# Patient Record
Sex: Female | Born: 1955
Health system: Southern US, Community
[De-identification: ages and names within clinical notes are randomized; demographics above are authoritative.]

## PROBLEM LIST (undated history)

## (undated) DIAGNOSIS — K219 Gastro-esophageal reflux disease without esophagitis: Secondary | ICD-10-CM

## (undated) DIAGNOSIS — Z8619 Personal history of other infectious and parasitic diseases: Secondary | ICD-10-CM

## (undated) DIAGNOSIS — F419 Anxiety disorder, unspecified: Secondary | ICD-10-CM

## (undated) DIAGNOSIS — E785 Hyperlipidemia, unspecified: Secondary | ICD-10-CM

## (undated) DIAGNOSIS — F32A Depression, unspecified: Secondary | ICD-10-CM

## (undated) DIAGNOSIS — I10 Essential (primary) hypertension: Secondary | ICD-10-CM

## (undated) DIAGNOSIS — E66813 Obesity, class 3: Secondary | ICD-10-CM

## (undated) DIAGNOSIS — M109 Gout, unspecified: Secondary | ICD-10-CM

## (undated) HISTORY — DX: Hyperlipidemia, unspecified: E78.5

## (undated) HISTORY — PX: BREAST BIOPSY: SHX20

## (undated) HISTORY — DX: Depression, unspecified: F32.A

## (undated) HISTORY — DX: Obesity, class 3: E66.813

## (undated) HISTORY — DX: Morbid (severe) obesity due to excess calories: E66.01

## (undated) HISTORY — DX: Personal history of other infectious and parasitic diseases: Z86.19

## (undated) HISTORY — DX: Gout, unspecified: M10.9

---

## 2017-05-06 DIAGNOSIS — N390 Urinary tract infection, site not specified: Secondary | ICD-10-CM | POA: Diagnosis not present

## 2017-05-06 DIAGNOSIS — R300 Dysuria: Secondary | ICD-10-CM | POA: Diagnosis not present

## 2017-05-06 DIAGNOSIS — Z6837 Body mass index (BMI) 37.0-37.9, adult: Secondary | ICD-10-CM | POA: Diagnosis not present

## 2017-05-15 ENCOUNTER — Emergency Department (HOSPITAL_COMMUNITY)
Admission: EM | Admit: 2017-05-15 | Discharge: 2017-05-15 | Disposition: A | Discharge status: Home / Self Care | Payer: Self-pay | Attending: Emergency Medicine | Admitting: Emergency Medicine

## 2017-05-15 ENCOUNTER — Other Ambulatory Visit: Payer: Self-pay

## 2017-05-15 ENCOUNTER — Encounter (HOSPITAL_COMMUNITY): Payer: Self-pay | Admitting: *Deleted

## 2017-05-15 ENCOUNTER — Emergency Department (HOSPITAL_COMMUNITY): Payer: Self-pay

## 2017-05-15 DIAGNOSIS — Y939 Activity, unspecified: Secondary | ICD-10-CM | POA: Insufficient documentation

## 2017-05-15 DIAGNOSIS — Y999 Unspecified external cause status: Secondary | ICD-10-CM | POA: Insufficient documentation

## 2017-05-15 DIAGNOSIS — W19XXXA Unspecified fall, initial encounter: Secondary | ICD-10-CM

## 2017-05-15 DIAGNOSIS — Y929 Unspecified place or not applicable: Secondary | ICD-10-CM | POA: Insufficient documentation

## 2017-05-15 DIAGNOSIS — I10 Essential (primary) hypertension: Secondary | ICD-10-CM | POA: Insufficient documentation

## 2017-05-15 DIAGNOSIS — S060X0A Concussion without loss of consciousness, initial encounter: Secondary | ICD-10-CM | POA: Insufficient documentation

## 2017-05-15 DIAGNOSIS — W11XXXA Fall on and from ladder, initial encounter: Secondary | ICD-10-CM | POA: Insufficient documentation

## 2017-05-15 DIAGNOSIS — S0990XA Unspecified injury of head, initial encounter: Secondary | ICD-10-CM

## 2017-05-15 DIAGNOSIS — Z79899 Other long term (current) drug therapy: Secondary | ICD-10-CM | POA: Insufficient documentation

## 2017-05-15 HISTORY — DX: Essential (primary) hypertension: I10

## 2017-05-15 LAB — CBC WITH DIFFERENTIAL/PLATELET
BASOS ABS: 0 10*3/uL (ref 0.0–0.1)
BASOS PCT: 0 %
EOS PCT: 3 %
Eosinophils Absolute: 0.2 10*3/uL (ref 0.0–0.7)
HCT: 39 % (ref 36.0–46.0)
Hemoglobin: 13.1 g/dL (ref 12.0–15.0)
Lymphocytes Relative: 23 %
Lymphs Abs: 1.3 10*3/uL (ref 0.7–4.0)
MCH: 29.9 pg (ref 26.0–34.0)
MCHC: 33.6 g/dL (ref 30.0–36.0)
MCV: 89 fL (ref 78.0–100.0)
Monocytes Absolute: 0.3 10*3/uL (ref 0.1–1.0)
Monocytes Relative: 6 %
Neutro Abs: 3.9 10*3/uL (ref 1.7–7.7)
Neutrophils Relative %: 68 %
PLATELETS: 223 10*3/uL (ref 150–400)
RBC: 4.38 MIL/uL (ref 3.87–5.11)
RDW: 14.3 % (ref 11.5–15.5)
WBC: 5.8 10*3/uL (ref 4.0–10.5)

## 2017-05-15 LAB — BASIC METABOLIC PANEL
ANION GAP: 10 (ref 5–15)
BUN: 24 mg/dL — ABNORMAL HIGH (ref 6–20)
CALCIUM: 9.4 mg/dL (ref 8.9–10.3)
CO2: 23 mmol/L (ref 22–32)
Chloride: 107 mmol/L (ref 101–111)
Creatinine, Ser: 1.21 mg/dL — ABNORMAL HIGH (ref 0.44–1.00)
GFR, EST AFRICAN AMERICAN: 55 mL/min — AB (ref >60)
GFR, EST NON AFRICAN AMERICAN: 48 mL/min — AB (ref >60)
GLUCOSE: 98 mg/dL (ref 65–99)
POTASSIUM: 4.5 mmol/L (ref 3.5–5.1)
Sodium: 140 mmol/L (ref 135–145)

## 2017-05-15 MED ORDER — PROMETHAZINE HCL 25 MG/ML IJ SOLN
12.5000 mg | Freq: Once | INTRAMUSCULAR | Status: AC
  Administered 2017-05-15: 12.5 mg via INTRAVENOUS
  Filled 2017-05-15: qty 1

## 2017-05-15 MED ORDER — HYDROCODONE-ACETAMINOPHEN 5-325 MG PO TABS: 1.0000 | ORAL_TABLET | Freq: Four times a day (QID) | ORAL | 0 refills | Status: DC | PRN

## 2017-05-15 MED ORDER — SODIUM CHLORIDE 0.9 % IV SOLN
INTRAVENOUS | Status: DC
  Administered 2017-05-15: 75 mL/h via INTRAVENOUS

## 2017-05-15 MED ORDER — FENTANYL CITRATE (PF) 100 MCG/2ML IJ SOLN
25.0000 ug | Freq: Once | INTRAMUSCULAR | Status: AC
  Administered 2017-05-15: 25 ug via INTRAVENOUS
  Filled 2017-05-15: qty 2

## 2017-05-15 MED ORDER — HYDROMORPHONE HCL 1 MG/ML IJ SOLN
1.0000 mg | Freq: Once | INTRAMUSCULAR | Status: AC
  Administered 2017-05-15: 1 mg via INTRAVENOUS
  Filled 2017-05-15: qty 1

## 2017-05-15 MED ORDER — SODIUM CHLORIDE 0.9 % IV BOLUS (SEPSIS)
500.0000 mL | Freq: Once | INTRAVENOUS | Status: AC
  Administered 2017-05-15: 500 mL via INTRAVENOUS

## 2017-05-15 MED ORDER — PROMETHAZINE HCL 25 MG/ML IJ SOLN: 25.0000 mg | Freq: Once | INTRAMUSCULAR | Status: DC

## 2017-05-15 MED ORDER — PROMETHAZINE HCL 25 MG PO TABS: 25.0000 mg | ORAL_TABLET | Freq: Four times a day (QID) | ORAL | 0 refills | Status: DC | PRN

## 2017-05-15 MED ORDER — ONDANSETRON HCL 4 MG/2ML IJ SOLN
4.0000 mg | Freq: Once | INTRAMUSCULAR | Status: AC
  Administered 2017-05-15: 4 mg via INTRAVENOUS
  Filled 2017-05-15: qty 2

## 2017-05-15 NOTE — ED Provider Notes (Signed)
[facility] Provider Note   CSN: [id number] Arrival date & time: 05/15/17  0930     History   Chief Complaint Chief Complaint  Patient presents with  . Fall  . Altered Mental Status    HPI Catherine Johnson is a 62 y.o. female.  Patient with a fall at work.  Brought in by EMS.  Patient fell off a ladder approximately 2 feet.  When her foot became trapped in the latter.  EMS reported brief loss of consciousness.  Patient reported to me that she had not been knocked out.  Patient with one episode of vomiting.  Improved with Zofran.  Upon arrival patient had a fair amount of photophobia could follow all commands no obvious neuro focal deficit.  Patient's only complaint was head pain.  Neck was wrapped in a towel and taped for support.  By EMS.  Patient denied any injuries to her low back arms or legs.      Past Medical History:  Diagnosis Date  . Hypertension     There are no active problems to display for this patient.   History reviewed. No pertinent surgical history.  OB History    No data available       Home Medications    Prior to Admission medications   Medication Sig Start Date End Date Taking? Authorizing Provider  allopurinol (ZYLOPRIM) 300 MG tablet Take 300 mg by mouth daily.   Yes [provider]  benazepril (LOTENSIN) 40 MG tablet Take 40 mg by mouth daily.   Yes [provider]  hydrochlorothiazide (HYDRODIURIL) 25 MG tablet Take 25 mg by mouth daily.   Yes [provider]  metoprolol tartrate (LOPRESSOR) 50 MG tablet Take 50 mg by mouth 2 (two) times daily.   Yes [provider]  Multiple Vitamin (MULTIVITAMIN WITH MINERALS) TABS tablet Take 1 tablet by mouth daily.   Yes [provider]  naproxen sodium (ALEVE) 220 MG tablet Take 440 mg by mouth as needed (headache).   Yes [provider]  tetrahydrozoline 0.05 % ophthalmic solution Place 1 drop into both eyes  as needed (dry eyes).   Yes [provider]  HYDROcodone-acetaminophen (NORCO/VICODIN) 5-325 MG tablet Take 1-2 tablets by mouth every 6 (six) hours as needed for moderate pain. 05/15/17   Fredia Sorrow, MD  promethazine (PHENERGAN) 25 MG tablet Take 1 tablet (25 mg total) by mouth every 6 (six) hours as needed for nausea or vomiting. 05/15/17   Fredia Sorrow, MD    Family History No family history on file.  Social History Social History   Tobacco Use  . Smoking status: Never Smoker  . Smokeless tobacco: Never Used  Substance Use Topics  . Alcohol use: No    Frequency: Never  . Drug use: No     Allergies   Patient has no known allergies.   Review of Systems Review of Systems  Constitutional: Negative for fever.  HENT: Negative for congestion.   Eyes: Positive for photophobia.  Respiratory: Negative for shortness of breath.   Cardiovascular: Negative for chest pain.  Gastrointestinal: Negative for abdominal pain.  Genitourinary: Negative for dysuria.  Musculoskeletal: Negative for back pain and neck pain.  Skin: Negative for wound.  Neurological: Positive for headaches.  Hematological: Does not bruise/bleed easily.  Psychiatric/Behavioral: Negative for confusion.     Physical Exam Updated Vital Signs BP (!) 159/92   Pulse 67   Temp 98.6 F (37 C) (Oral)   Resp  17   Ht 1.854 m (6\' 1" )   Wt 122.5 kg (270 lb)   SpO2 99%   BMI 35.62 kg/m   Physical Exam  Constitutional: She is oriented to person, place, and time. She appears well-developed and well-nourished. No distress.  HENT:  Head: Normocephalic and atraumatic.  Mouth/Throat: Oropharynx is clear and moist.  Eyes: Conjunctivae and EOM are normal. Pupils are equal, round, and reactive to light.  Neck: Normal range of motion. Neck supple.  Cardiovascular: Normal rate, regular rhythm and normal heart sounds.  Pulmonary/Chest: Effort normal and breath sounds normal. She exhibits no tenderness.    Abdominal: Soft. Bowel sounds are normal. There is no tenderness.  Musculoskeletal: Normal range of motion. She exhibits no tenderness.  Neurological: She is alert and oriented to person, place, and time. No cranial nerve deficit or sensory deficit. She exhibits normal muscle tone. Coordination normal.  Skin: Skin is warm.  Nursing note and vitals reviewed.    ED Treatments / Results  Labs (all labs ordered are listed, but only abnormal results are displayed) Labs Reviewed  BASIC METABOLIC PANEL - Abnormal; Notable for the following components:      Result Value   BUN 24 (*)    Creatinine, Ser 1.21 (*)    GFR calc non Af Amer 48 (*)    GFR calc Af Amer 55 (*)    All other components within normal limits  CBC WITH DIFFERENTIAL/PLATELET    EKG  EKG Interpretation None       Radiology Ct Head Wo Contrast  Result Date: 05/15/2017 CLINICAL DATA:  Fall from ladder. EXAM: CT HEAD WITHOUT CONTRAST CT CERVICAL SPINE WITHOUT CONTRAST TECHNIQUE: Multidetector CT imaging of the head and cervical spine was performed following the standard protocol without intravenous contrast. Multiplanar CT image reconstructions of the cervical spine were also generated. COMPARISON:  None. FINDINGS: CT HEAD FINDINGS Brain: No acute intracranial abnormality. Specifically, no hemorrhage, hydrocephalus, mass lesion, acute infarction, or significant intracranial injury. Vascular: No hyperdense vessel or unexpected calcification. Skull: No acute calvarial abnormality. Sinuses/Orbits: Visualized paranasal sinuses and mastoids clear. Orbital soft tissues unremarkable. Other: None CT CERVICAL SPINE FINDINGS Alignment: Normal Skull base and vertebrae: No fracture Soft tissues and spinal canal: Prevertebral soft tissues are normal. No epidural or paraspinal hematoma. Disc levels: Degenerative disc disease with disc space narrowing and spurring, most pronounced at C5-6. Upper chest: No acute findings Other: No acute  findings IMPRESSION: No acute intracranial abnormality. No acute bony abnormality in the cervical spine. Electronically Signed   By: Rolm Baptise M.D.   On: 05/15/2017 10:18   Ct Cervical Spine Wo Contrast  Result Date: 05/15/2017 CLINICAL DATA:  Fall from ladder. EXAM: CT HEAD WITHOUT CONTRAST CT CERVICAL SPINE WITHOUT CONTRAST TECHNIQUE: Multidetector CT imaging of the head and cervical spine was performed following the standard protocol without intravenous contrast. Multiplanar CT image reconstructions of the cervical spine were also generated. COMPARISON:  None. FINDINGS: CT HEAD FINDINGS Brain: No acute intracranial abnormality. Specifically, no hemorrhage, hydrocephalus, mass lesion, acute infarction, or significant intracranial injury. Vascular: No hyperdense vessel or unexpected calcification. Skull: No acute calvarial abnormality. Sinuses/Orbits: Visualized paranasal sinuses and mastoids clear. Orbital soft tissues unremarkable. Other: None CT CERVICAL SPINE FINDINGS Alignment: Normal Skull base and vertebrae: No fracture Soft tissues and spinal canal: Prevertebral soft tissues are normal. No epidural or paraspinal hematoma. Disc levels: Degenerative disc disease with disc space narrowing and spurring, most pronounced at C5-6. Upper chest: No acute findings Other: No  acute findings IMPRESSION: No acute intracranial abnormality. No acute bony abnormality in the cervical spine. Electronically Signed   By: Rolm Baptise M.D.   On: 05/15/2017 10:18    Procedures Procedures (including critical care time)  Medications Ordered in ED Medications  0.9 %  sodium chloride infusion (75 mL/hr Intravenous New Bag/Given 05/15/17 1120)  sodium chloride 0.9 % bolus 500 mL (500 mLs Intravenous New Bag/Given 05/15/17 1120)  ondansetron (ZOFRAN) injection 4 mg (4 mg Intravenous Given 05/15/17 1015)  fentaNYL (SUBLIMAZE) injection 25 mcg (25 mcg Intravenous Given 05/15/17 1119)  HYDROmorphone (DILAUDID) injection 1 mg (1  mg Intravenous Given 05/15/17 1251)  promethazine (PHENERGAN) injection 12.5 mg (12.5 mg Intravenous Given 05/15/17 1251)     Initial Impression / Assessment and Plan / ED Course  I have reviewed the triage vital signs and the nursing notes.  Pertinent labs & imaging results that were available during my care of the patient were reviewed by me and considered in my medical decision making (see chart for details).    Patient symptoms consistent with concussion.  There may have been a brief very brief period of loss of consciousness.  Patient's workup with CT head and neck and labs without any significant abnormalities.  Patient will need to be out of work for 1 week.  Will need follow-up by neurology. Bobby Rumpf   Final Clinical Impressions(s) / ED Diagnoses   Final diagnoses:  Fall, initial encounter  Injury of head, initial encounter  Concussion without loss of consciousness, initial encounter    ED Discharge Orders        Ordered    HYDROcodone-acetaminophen (NORCO/VICODIN) 5-325 MG tablet  Every 6 hours PRN     05/15/17 1441    promethazine (PHENERGAN) 25 MG tablet  Every 6 hours PRN     05/15/17 1441       Fredia Sorrow, MD 05/16/17 [phone]

## 2017-05-15 NOTE — ED Triage Notes (Signed)
Pt here from work via [employer] after falling off a ladder approx 2 feet, when her foot became trapped in the ladder.  Brief loc, several seconds.  Pt initially unable to speak and could not state name  And situation to ems for several minutes.  Emesis x 1, but relief from nausea with zofran.  Alert to self and able to follow commands.

## 2017-05-15 NOTE — ED Notes (Signed)
IV removed. Cathter in tact

## 2017-05-15 NOTE — Discharge Instructions (Signed)
Referral information provided for neurology.  Call and make an appointment.  Work note provided to be out of work for a week.  Take the hydrocodone as needed for the head pain.  And take the Phenergan as needed for the nausea and vomiting.

## 2017-05-26 ENCOUNTER — Emergency Department (HOSPITAL_BASED_OUTPATIENT_CLINIC_OR_DEPARTMENT_OTHER)
Admission: EM | Admit: 2017-05-26 | Discharge: 2017-05-26 | Disposition: A | Discharge status: Home / Self Care | Payer: Self-pay | Attending: Emergency Medicine | Admitting: Emergency Medicine

## 2017-05-26 ENCOUNTER — Encounter (HOSPITAL_BASED_OUTPATIENT_CLINIC_OR_DEPARTMENT_OTHER): Payer: Self-pay | Admitting: *Deleted

## 2017-05-26 ENCOUNTER — Other Ambulatory Visit: Payer: Self-pay

## 2017-05-26 DIAGNOSIS — Z79899 Other long term (current) drug therapy: Secondary | ICD-10-CM | POA: Insufficient documentation

## 2017-05-26 DIAGNOSIS — Y9389 Activity, other specified: Secondary | ICD-10-CM | POA: Insufficient documentation

## 2017-05-26 DIAGNOSIS — Y999 Unspecified external cause status: Secondary | ICD-10-CM | POA: Insufficient documentation

## 2017-05-26 DIAGNOSIS — S161XXA Strain of muscle, fascia and tendon at neck level, initial encounter: Secondary | ICD-10-CM | POA: Insufficient documentation

## 2017-05-26 DIAGNOSIS — W1789XA Other fall from one level to another, initial encounter: Secondary | ICD-10-CM | POA: Insufficient documentation

## 2017-05-26 DIAGNOSIS — W19XXXD Unspecified fall, subsequent encounter: Secondary | ICD-10-CM

## 2017-05-26 DIAGNOSIS — I10 Essential (primary) hypertension: Secondary | ICD-10-CM | POA: Insufficient documentation

## 2017-05-26 DIAGNOSIS — Y929 Unspecified place or not applicable: Secondary | ICD-10-CM | POA: Insufficient documentation

## 2017-05-26 MED ORDER — DIAZEPAM 5 MG PO TABS
5.0000 mg | ORAL_TABLET | Freq: Once | ORAL | Status: AC
  Administered 2017-05-26: 5 mg via ORAL
  Filled 2017-05-26: qty 1

## 2017-05-26 MED ORDER — LIDOCAINE 5 % EX PTCH
1.0000 | MEDICATED_PATCH | CUTANEOUS | Status: DC
  Filled 2017-05-26: qty 1

## 2017-05-26 MED ORDER — LIDOCAINE 5 % EX PTCH: 1.0000 | MEDICATED_PATCH | CUTANEOUS | 0 refills | Status: DC

## 2017-05-26 MED ORDER — NAPROXEN 500 MG PO TABS: 500.0000 mg | ORAL_TABLET | Freq: Two times a day (BID) | ORAL | 0 refills | Status: DC

## 2017-05-26 MED ORDER — KETOROLAC TROMETHAMINE 60 MG/2ML IM SOLN
30.0000 mg | Freq: Once | INTRAMUSCULAR | Status: AC
  Administered 2017-05-26: 30 mg via INTRAMUSCULAR
  Filled 2017-05-26: qty 2

## 2017-05-26 MED ORDER — DIAZEPAM 5 MG PO TABS: 5.0000 mg | ORAL_TABLET | Freq: Two times a day (BID) | ORAL | 0 refills | Status: DC | PRN

## 2017-05-26 NOTE — ED Triage Notes (Signed)
Pt fell at work on 05/15/17. She has been complaining of neck, back, and head pain that is worsening.

## 2017-05-26 NOTE — ED Notes (Signed)
NAD at this time. Pt is stable and going home.  

## 2017-05-26 NOTE — Discharge Instructions (Addendum)
We recommend the use of naproxen daily as prescribed.  You may supplement this with Valium as needed for muscle spasms.  Apply a Lidoderm patch to the back of your neck and leave on for 12 hours before removing.  This will help with additional pain control.  Should your symptoms persist, you may take the pain medication previously prescribed to you.  Alternate ice and heat to areas of pain/injury to limit swelling and muscle spasm.  We advise follow-up with a neurologist as well as an orthopedist regarding your visit today.

## 2017-05-26 NOTE — ED Provider Notes (Signed)
[facility] EMERGENCY DEPARTMENT Provider Note   CSN: [id number] Arrival date & time: 05/26/17  0849     History   Chief Complaint Chief Complaint  Patient presents with  . Fall    HPI Catherine Johnson is a 62 y.o. female.   62 year old female with a history of hypertension presents to the emergency department for persistent pain after a fall at work on 05/15/2017.  Patient reports falling off a ladder.  Previous note indicates fall from height of approximately 2 feet.  Patient did strike her head during the fall and reports loss of consciousness.  She had negative head and neck CTs when evaluated on the date of her injury.  She has since followed up with a primary care doctor, but states that the oxycodone prescribed to her has not been helping her pain.  She notes that she is experiencing constant soreness which is sometimes worse in the night, causing her to wake from sleep.  She has been taking Aleve fairly regularly as well.  She was prescribed a nausea medication to take for nausea and lack of appetite.  She notes intermittent dizziness as well.  No repeat syncope or inability to ambulate.  She denies bowel and bladder incontinence.  No extremity weakness.  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   The history is provided by the patient. No language interpreter was used.  Fall     Past Medical History:  Diagnosis Date  . Hypertension     There are no active problems to display for this patient.   History reviewed. No pertinent surgical history.  OB History    No data available       Home Medications    Prior to Admission medications   Medication Sig Start Date End Date Taking? Authorizing Provider  allopurinol (ZYLOPRIM) 300 MG tablet Take 300 mg by mouth daily.   Yes [provider]  benazepril (LOTENSIN) 40 MG tablet Take 40 mg by mouth daily.   Yes  [provider]  hydrochlorothiazide (HYDRODIURIL) 25 MG tablet Take 25 mg by mouth daily.   Yes [provider]  HYDROcodone-acetaminophen (NORCO/VICODIN) 5-325 MG tablet Take 1-2 tablets by mouth every 6 (six) hours as needed for moderate pain. 05/15/17  Yes Fredia Sorrow, MD  metoprolol tartrate (LOPRESSOR) 50 MG tablet Take 50 mg by mouth 2 (two) times daily.   Yes [provider]  Multiple Vitamin (MULTIVITAMIN WITH MINERALS) TABS tablet Take 1 tablet by mouth daily.   Yes [provider]  promethazine (PHENERGAN) 25 MG tablet Take 1 tablet (25 mg total) by mouth every 6 (six) hours as needed for nausea or vomiting. 05/15/17  Yes Fredia Sorrow, MD  tetrahydrozoline 0.05 % ophthalmic solution Place 1 drop into both eyes as needed (dry eyes).   Yes [provider]  diazepam (VALIUM) 5 MG tablet Take 1 tablet (5 mg total) by mouth every 12 (twelve) hours as needed for anxiety or muscle spasms. 05/26/17   Antonietta Breach, PA-C  lidocaine (LIDODERM) 5 % Place 1 patch onto the skin daily. Remove & Discard patch within 12 hours 05/26/17   Antonietta Breach, PA-C  naproxen (NAPROSYN) 500 MG tablet Take 1 tablet (500 mg total) by mouth 2 (two) times daily. 05/26/17   Antonietta Breach, PA-C    Family History History reviewed. No pertinent family history.  Social History Social History   Tobacco  Use  . Smoking status: Never Smoker  . Smokeless tobacco: Never Used  Substance Use Topics  . Alcohol use: No    Frequency: Never  . Drug use: No     Allergies   Patient has no known allergies.   Review of Systems Review of Systems Ten systems reviewed and are negative for acute change, except as noted in the HPI.    Physical Exam Updated Vital Signs BP (!) 130/97 (BP Location: Right Arm)   Pulse 73   Temp 98.4 F (36.9 C) (Oral)   Resp 16   Ht 6\' 1"  (1.854 m)   Wt 122.5 kg (270 lb)   SpO2 99%   BMI 35.62 kg/m   Physical Exam  Constitutional: She is  oriented to person, place, and time. She appears well-developed and well-nourished. No distress.  Nontoxic appearing and in NAD  HENT:  Head: Normocephalic and atraumatic.  Eyes: Conjunctivae and EOM are normal. No scleral icterus.  Neck:  Limited ROM 2/2 patient discomfort.  No bony deformities, step-offs, crepitus  Cardiovascular: Normal rate, regular rhythm and intact distal pulses.  Pulmonary/Chest: Effort normal. No respiratory distress.  Respirations even and unlabored  Musculoskeletal: Normal range of motion.  Diffuse TTP along bilateral trapezius muscles. No appreciable spasm. No TTP to the thoracic or lumbosacral midline.  Neurological: She is alert and oriented to person, place, and time. She exhibits normal muscle tone. Coordination normal.  Equal grip strength. Ambulatory with steady gait.  Skin: Skin is warm and dry. No rash noted. She is not diaphoretic. No erythema. No pallor.  Psychiatric: She has a normal mood and affect. Her behavior is normal.  Nursing note and vitals reviewed.    ED Treatments / Results  Labs (all labs ordered are listed, but only abnormal results are displayed) Labs Reviewed - No data to display  EKG  EKG Interpretation None       Radiology No results found.  Procedures Procedures (including critical care time)  Medications Ordered in ED Medications  ketorolac (TORADOL) injection 30 mg (30 mg Intramuscular Given 05/26/17 1024)  diazepam (VALIUM) tablet 5 mg (5 mg Oral Given 05/26/17 1024)    11:09 AM Patient reassessed.  She states that she is feeling better following ED medications.  Plan to discharge with naproxen 500 mg twice daily, Valium 5 mg twice daily as needed, Lidoderm patches   Initial Impression / Assessment and Plan / ED Course  I have reviewed the triage vital signs and the nursing notes.  Pertinent labs & imaging results that were available during my care of the patient were reviewed by me and considered in my  medical decision making (see chart for details).     62 year old female presents to the emergency department for evaluation of persistent discomfort following a fall 10 days ago.  She was seen in the emergency department on the date of injury with a negative head CT and cervical spine CT.  She has since received negative outpatient x-rays of her shoulder and right knee.  She reports persistent aching discomfort despite use of over-the-counter naproxen and home oxycodone.  She continues to complain of dizziness as well as intermittent nausea, most consistent with persistent concussive symptoms.  Patient is neurovascularly intact today and ambulatory.  She has no focal neurologic deficits on exam.  No repeat trauma or injury.  No history of bowel or bladder incontinence.  No red flags or signs concerning for cauda equina.  She has most tenderness along the course  of her bilateral trapezius muscles.  Suspect her neck discomfort to be due to persistent cervical strain and spasm.  The patient has received IM Toradol as well as Valium in the emergency department with improvement in her discomfort.  Plan for continued outpatient supportive measures.  I do not believe further emergent workup or imaging is currently indicated.  I have referred the patient to neurology to evaluate for postconcussive syndrome.  Orthopedic referral also provided given persistent knee pain.  Return precautions discussed and provided. Patient discharged in stable condition with no unaddressed concerns.   Final Clinical Impressions(s) / ED Diagnoses   Final diagnoses:  Fall, subsequent encounter  Neck strain, initial encounter    ED Discharge Orders        Ordered    naproxen (NAPROSYN) 500 MG tablet  2 times daily     05/26/17 1113    diazepam (VALIUM) 5 MG tablet  Every 12 hours PRN     05/26/17 1113    lidocaine (LIDODERM) 5 %  Every 24 hours     05/26/17 1113       Antonietta Breach, PA-C 05/26/17 1145    Hayden Rasmussen, MD 05/27/17 1942

## 2017-06-09 DIAGNOSIS — M25461 Effusion, right knee: Secondary | ICD-10-CM | POA: Insufficient documentation

## 2017-07-21 DIAGNOSIS — M1711 Unilateral primary osteoarthritis, right knee: Secondary | ICD-10-CM | POA: Insufficient documentation

## 2017-08-09 ENCOUNTER — Other Ambulatory Visit: Payer: Self-pay

## 2017-08-09 DIAGNOSIS — Z1231 Encounter for screening mammogram for malignant neoplasm of breast: Secondary | ICD-10-CM

## 2017-08-10 DIAGNOSIS — I10 Essential (primary) hypertension: Secondary | ICD-10-CM | POA: Diagnosis not present

## 2017-08-10 DIAGNOSIS — L304 Erythema intertrigo: Secondary | ICD-10-CM | POA: Diagnosis not present

## 2017-08-10 DIAGNOSIS — N3941 Urge incontinence: Secondary | ICD-10-CM | POA: Diagnosis not present

## 2017-08-10 DIAGNOSIS — R3915 Urgency of urination: Secondary | ICD-10-CM | POA: Diagnosis not present

## 2017-08-25 DIAGNOSIS — G894 Chronic pain syndrome: Secondary | ICD-10-CM | POA: Insufficient documentation

## 2017-08-25 DIAGNOSIS — M47812 Spondylosis without myelopathy or radiculopathy, cervical region: Secondary | ICD-10-CM | POA: Insufficient documentation

## 2017-09-29 ENCOUNTER — Other Ambulatory Visit: Payer: Self-pay | Admitting: Obstetrics and Gynecology

## 2017-09-29 DIAGNOSIS — Z1231 Encounter for screening mammogram for malignant neoplasm of breast: Secondary | ICD-10-CM

## 2017-10-31 ENCOUNTER — Ambulatory Visit
Admission: RE | Admit: 2017-10-31 | Discharge: 2017-10-31 | Disposition: A | Discharge status: Home / Self Care | Payer: No Typology Code available for payment source | Source: Ambulatory Visit | Attending: Obstetrics and Gynecology | Admitting: Obstetrics and Gynecology

## 2017-10-31 ENCOUNTER — Ambulatory Visit (HOSPITAL_COMMUNITY)
Admission: RE | Admit: 2017-10-31 | Discharge: 2017-10-31 | Disposition: A | Discharge status: Home / Self Care | Payer: Self-pay | Source: Ambulatory Visit | Attending: Obstetrics and Gynecology | Admitting: Obstetrics and Gynecology

## 2017-10-31 ENCOUNTER — Encounter (HOSPITAL_COMMUNITY): Payer: Self-pay

## 2017-10-31 VITALS — BP 156/94

## 2017-10-31 DIAGNOSIS — Z1239 Encounter for other screening for malignant neoplasm of breast: Secondary | ICD-10-CM

## 2017-10-31 DIAGNOSIS — Z1231 Encounter for screening mammogram for malignant neoplasm of breast: Secondary | ICD-10-CM

## 2017-10-31 NOTE — Patient Instructions (Signed)
Explained breast self awareness with Cathie Olden. Patient did not need a Pap smear today due to last Pap smear was in December 2016 per patient. Let her know [facility] will cover Pap smears every 3 years unless has a history of abnormal Pap smears. Referred patient to the [facility] for a screening mammogram. Appointment scheduled for Tuesday, October 31, 2017 at 1600. Let patient know the [facility] will follow up with her within the next couple weeks with results of mammogram by letter or phone. Shavana Calder verbalized understanding.  Kupono Marling, Arvil Chaco, RN 4:05 PM

## 2017-10-31 NOTE — Progress Notes (Signed)
No complaints today.   Pap Smear: Pap smear not completed today. Last Pap smear was in December 2016 at [facility] and normal per patient. Per patient has a history of an abnormal Pap smear around 15 years ago that cryotherapy was completed for follow-up. Per patient has had at least three normal Pap smears since cryotherapy. No Pap smear results are in Epic.  Physical exam: Breasts Breasts symmetrical. No skin abnormalities bilateral breasts. No nipple retraction bilateral breasts. No nipple discharge bilateral breasts. No lymphadenopathy. No lumps palpated bilateral breasts. No complaints of pain or tenderness on exam. Referred patient to the [facility] for a screening mammogram. Appointment scheduled for Tuesday, October 31, 2017 at 1600.        Pelvic/Bimanual No Pap smear completed today since last Pap smear was in December 2016 per patient. Pap smear not indicated per [facility] guidelines.   Smoking History: Patient has never smoked.  Patient Navigation: Patient education provided. Access to services provided for patient through [facility] program.   Colorectal Cancer Screening: Per patient had a colonoscopy completed 10 years. No complaints today. FIT Test given to patient to complete and return to [facility].  Breast and Cervical Cancer Risk Assessment: Patient has no family history of breast cancer, known genetic mutations, or radiation treatment to the chest before age 35. Per patient has a history of cervical dysplasia. Patient has no history of being immunocompromised or DES exposure in-utero. Risk Assessment    Risk Scores      10/31/2017   Last edited by: Armond Hang, LPN   5-year risk: 1.1 %   Lifetime risk: 5.3 %

## 2017-11-01 ENCOUNTER — Encounter (HOSPITAL_COMMUNITY): Payer: Self-pay | Admitting: *Deleted

## 2017-11-02 ENCOUNTER — Ambulatory Visit (HOSPITAL_COMMUNITY): Payer: Self-pay

## 2018-02-13 ENCOUNTER — Encounter (HOSPITAL_COMMUNITY): Payer: Self-pay

## 2018-02-13 ENCOUNTER — Ambulatory Visit (HOSPITAL_COMMUNITY)
Admission: RE | Admit: 2018-02-13 | Discharge: 2018-02-13 | Disposition: A | Discharge status: Home / Self Care | Payer: No Typology Code available for payment source | Source: Ambulatory Visit | Attending: Obstetrics and Gynecology | Admitting: Obstetrics and Gynecology

## 2018-02-13 VITALS — BP 132/86 | Wt 284.0 lb

## 2018-02-13 DIAGNOSIS — Z01419 Encounter for gynecological examination (general) (routine) without abnormal findings: Secondary | ICD-10-CM

## 2018-02-13 NOTE — Addendum Note (Signed)
Encounter addended by: Armond Hang, LPN on: 87/07/7970 8:20 PM  Actions taken: Order list changed

## 2018-02-13 NOTE — Progress Notes (Signed)
No complaints today.   Pap Smear: Pap smear completed today. Last Pap smear was in December 2016 at [facility] and normal per patient. Per patient has a history of an abnormal Pap smear around 15 years ago that cryotherapy was completed for follow-up. Per patient has had at least three normal Pap smears since cryotherapy. No Pap smear results are in Epic.  Pelvic/Bimanual   Ext Genitalia No lesions, no swelling and no discharge observed on external genitalia.         Vagina Vagina pink and normal texture. No lesions or discharge observed in vagina.          Cervix Cervix is present. Cervix pink and of normal texture. Cervix friable. No discharge observed.     Uterus Uterus is present and palpable. Uterus in normal position and normal size.        Adnexae Bilateral ovaries present and palpable. No tenderness on palpation.         Rectovaginal No rectal exam completed today since patient had no rectal complaints. No skin abnormalities observed on exam.    Smoking History: Patient has never smoked.  Patient Navigation: Patient education provided. Access to services provided for patient through [facility] program.   Colorectal Cancer Screening: Per patient had a colonoscopy completed 10 years. No complaints today. FIT Test givento patient 10/31/2017 to complete and return to [facility].  Breast and Cervical Cancer Risk Assessment: Patient has no family history of breast cancer, known genetic mutations, or radiation treatment to the chest before age 67. Patient has no history of cervical dysplasia, immunocompromised, or DES exposure in-utero.  Risk Assessment    Risk Scores      02/13/2018 10/31/2017   Last edited by: Armond Hang, LPN Rolena Infante H, LPN   5-year risk: 1.1 % 1.1 %   Lifetime risk: 5.2 % 5.3 %

## 2018-02-13 NOTE — Patient Instructions (Signed)
Explained to [employer] that [facility] will cover Pap smears and HPV typing every 5 years unless has a history of abnormal Pap smears. Let patient know will follow up with her within the next couple weeks with results of Pap smear by letter or phone. Catherine Johnson verbalized understanding.  Catherine Johnson, Catherine Chaco, RN 1:47 PM

## 2018-02-15 LAB — CYTOLOGY - PAP
Diagnosis: NEGATIVE
HPV (WINDOPATH): NOT DETECTED

## 2018-02-28 ENCOUNTER — Telehealth (HOSPITAL_COMMUNITY): Payer: Self-pay | Admitting: *Deleted

## 2018-02-28 ENCOUNTER — Encounter (HOSPITAL_COMMUNITY): Payer: Self-pay | Admitting: *Deleted

## 2018-02-28 NOTE — Telephone Encounter (Signed)
Telephoned patient at home number and advised patient of negative pap smear results. HPV was negative. Next pap smear due in five years. Patient voiced understanding.  

## 2018-02-28 NOTE — Progress Notes (Signed)
Letter mailed to patient with negative pap smear results. HPV was negative. Next pap smear due in five years. 

## 2018-05-22 DIAGNOSIS — M25512 Pain in left shoulder: Secondary | ICD-10-CM | POA: Insufficient documentation

## 2018-06-20 DIAGNOSIS — M75102 Unspecified rotator cuff tear or rupture of left shoulder, not specified as traumatic: Secondary | ICD-10-CM | POA: Insufficient documentation

## 2018-11-13 ENCOUNTER — Other Ambulatory Visit (HOSPITAL_COMMUNITY): Payer: Self-pay | Admitting: *Deleted

## 2018-11-13 DIAGNOSIS — Z1231 Encounter for screening mammogram for malignant neoplasm of breast: Secondary | ICD-10-CM

## 2019-01-24 ENCOUNTER — Encounter (HOSPITAL_COMMUNITY): Payer: Self-pay

## 2019-01-24 ENCOUNTER — Ambulatory Visit (HOSPITAL_COMMUNITY)
Admission: RE | Admit: 2019-01-24 | Discharge: 2019-01-24 | Disposition: A | Discharge status: Home / Self Care | Payer: No Typology Code available for payment source | Source: Ambulatory Visit | Attending: Obstetrics and Gynecology | Admitting: Obstetrics and Gynecology

## 2019-01-24 ENCOUNTER — Other Ambulatory Visit: Payer: Self-pay

## 2019-01-24 ENCOUNTER — Ambulatory Visit
Admission: RE | Admit: 2019-01-24 | Discharge: 2019-01-24 | Disposition: A | Discharge status: Home / Self Care | Payer: No Typology Code available for payment source | Source: Ambulatory Visit | Attending: Obstetrics and Gynecology | Admitting: Obstetrics and Gynecology

## 2019-01-24 DIAGNOSIS — Z1239 Encounter for other screening for malignant neoplasm of breast: Secondary | ICD-10-CM

## 2019-01-24 DIAGNOSIS — Z1231 Encounter for screening mammogram for malignant neoplasm of breast: Secondary | ICD-10-CM

## 2019-01-24 NOTE — Patient Instructions (Signed)
Explained breast self awareness with Cathie Olden. Patient did not need a Pap smear today due to last Pap smear and HPV typing was 02/13/2018. Let her know [facility] will cover Pap smears and HPV typing every 5 years unless has a history of abnormal Pap smears. Referred patient to the [facility] for a screening mammogram. Appointment scheduled for Thursday, January 24, 2019 at 1530. Patient aware of appointment and will be there. Let patient know the [facility] will follow up with her within the next couple weeks with results of mammogram by letter or phone. Dandra Ekman verbalized understanding.  Avanthika Dehnert, Arvil Chaco, RN 2:18 PM

## 2019-01-24 NOTE — Progress Notes (Signed)
No complaints today.   Pap Smear: Pap smear not completed today. Last Pap smear was 02/13/2018 at [facility] and normal with negative HPV. Per patient has a history of an abnormal Pap smear around 15 years ago that cryotherapy was completed for follow-up. Per patient has had at least three normal Pap smears since cryotherapy. No Pap smear results are in Epic.  Physical exam: Breasts Breasts symmetrical. No skin abnormalities bilateral breasts. No nipple retraction bilateral breasts. No nipple discharge bilateral breasts. No lymphadenopathy. No lumps palpated bilateral breasts. No complaints of pain or tenderness on exam. Referred patient to the [facility] for a screening mammogram. Appointment scheduled for Thursday, January 24, 2019 at 1530.        Pelvic/Bimanual No Pap smear completed today since last Pap smear and HPV typing was 02/13/2018. Pap smear not indicated per BCCCP guidelines.   Smoking History: Patient has never smoked.  Patient Navigation: Patient education provided. Access to services provided for patient through [facility] program.   Colorectal Cancer Screening: Per patient had a colonoscopy completed 12 years. No complaints today.  Breast and Cervical Cancer Risk Assessment: Patient has no family history of breast cancer, known genetic mutations, or radiation treatment to the chest before age 6. Per patient has a history of cervical dysplasia. Patient has no history of being immunocompromised or DES exposure in-utero.  Risk Assessment    Risk Scores      01/24/2019 02/13/2018   Last edited by: Loletta Parish, RN Armond Hang, LPN   5-year risk: 1.2 % 1.1 %   Lifetime risk: 5 % 5.2 %

## 2019-04-24 DIAGNOSIS — F329 Major depressive disorder, single episode, unspecified: Secondary | ICD-10-CM | POA: Diagnosis not present

## 2019-07-13 HISTORY — PX: COLONOSCOPY: SHX174

## 2019-07-22 DIAGNOSIS — Z1159 Encounter for screening for other viral diseases: Secondary | ICD-10-CM | POA: Diagnosis not present

## 2019-07-25 DIAGNOSIS — D123 Benign neoplasm of transverse colon: Secondary | ICD-10-CM | POA: Diagnosis not present

## 2019-07-25 DIAGNOSIS — D120 Benign neoplasm of cecum: Secondary | ICD-10-CM | POA: Diagnosis not present

## 2019-07-25 DIAGNOSIS — K648 Other hemorrhoids: Secondary | ICD-10-CM | POA: Diagnosis not present

## 2019-07-25 DIAGNOSIS — K5730 Diverticulosis of large intestine without perforation or abscess without bleeding: Secondary | ICD-10-CM | POA: Diagnosis not present

## 2019-07-25 DIAGNOSIS — Z1211 Encounter for screening for malignant neoplasm of colon: Secondary | ICD-10-CM | POA: Diagnosis not present

## 2019-07-25 DIAGNOSIS — D122 Benign neoplasm of ascending colon: Secondary | ICD-10-CM | POA: Diagnosis not present

## 2019-08-08 DIAGNOSIS — R748 Abnormal levels of other serum enzymes: Secondary | ICD-10-CM | POA: Diagnosis not present

## 2019-08-08 DIAGNOSIS — R899 Unspecified abnormal finding in specimens from other organs, systems and tissues: Secondary | ICD-10-CM | POA: Diagnosis not present

## 2019-12-17 ENCOUNTER — Other Ambulatory Visit: Payer: Self-pay | Admitting: Obstetrics and Gynecology

## 2019-12-17 DIAGNOSIS — Z0000 Encounter for general adult medical examination without abnormal findings: Secondary | ICD-10-CM

## 2020-01-23 DIAGNOSIS — M2550 Pain in unspecified joint: Secondary | ICD-10-CM | POA: Diagnosis not present

## 2020-01-23 DIAGNOSIS — M1A9XX0 Chronic gout, unspecified, without tophus (tophi): Secondary | ICD-10-CM | POA: Diagnosis not present

## 2020-01-23 DIAGNOSIS — I10 Essential (primary) hypertension: Secondary | ICD-10-CM | POA: Diagnosis not present

## 2020-01-23 DIAGNOSIS — R7401 Elevation of levels of liver transaminase levels: Secondary | ICD-10-CM | POA: Diagnosis not present

## 2020-01-23 DIAGNOSIS — E559 Vitamin D deficiency, unspecified: Secondary | ICD-10-CM | POA: Diagnosis not present

## 2020-01-27 ENCOUNTER — Other Ambulatory Visit: Payer: Self-pay

## 2020-01-27 ENCOUNTER — Ambulatory Visit
Admission: RE | Admit: 2020-01-27 | Discharge: 2020-01-27 | Disposition: A | Discharge status: Home / Self Care | Payer: BC Managed Care – PPO | Source: Ambulatory Visit | Attending: Obstetrics and Gynecology | Admitting: Obstetrics and Gynecology

## 2020-01-27 DIAGNOSIS — Z0000 Encounter for general adult medical examination without abnormal findings: Secondary | ICD-10-CM

## 2020-01-27 DIAGNOSIS — Z1231 Encounter for screening mammogram for malignant neoplasm of breast: Secondary | ICD-10-CM | POA: Diagnosis not present

## 2020-07-31 DIAGNOSIS — I10 Essential (primary) hypertension: Secondary | ICD-10-CM | POA: Diagnosis not present

## 2020-07-31 DIAGNOSIS — Z0000 Encounter for general adult medical examination without abnormal findings: Secondary | ICD-10-CM | POA: Diagnosis not present

## 2020-07-31 DIAGNOSIS — Z23 Encounter for immunization: Secondary | ICD-10-CM | POA: Diagnosis not present

## 2020-07-31 DIAGNOSIS — E559 Vitamin D deficiency, unspecified: Secondary | ICD-10-CM | POA: Diagnosis not present

## 2020-07-31 DIAGNOSIS — Z13220 Encounter for screening for lipoid disorders: Secondary | ICD-10-CM | POA: Diagnosis not present

## 2020-07-31 DIAGNOSIS — E785 Hyperlipidemia, unspecified: Secondary | ICD-10-CM | POA: Diagnosis not present

## 2020-07-31 DIAGNOSIS — M1A9XX0 Chronic gout, unspecified, without tophus (tophi): Secondary | ICD-10-CM | POA: Diagnosis not present

## 2020-07-31 DIAGNOSIS — F3341 Major depressive disorder, recurrent, in partial remission: Secondary | ICD-10-CM | POA: Diagnosis not present

## 2020-08-26 DIAGNOSIS — N766 Ulceration of vulva: Secondary | ICD-10-CM | POA: Diagnosis not present

## 2020-08-26 DIAGNOSIS — R198 Other specified symptoms and signs involving the digestive system and abdomen: Secondary | ICD-10-CM | POA: Diagnosis not present

## 2020-08-26 DIAGNOSIS — Z01419 Encounter for gynecological examination (general) (routine) without abnormal findings: Secondary | ICD-10-CM | POA: Diagnosis not present

## 2020-09-01 ENCOUNTER — Other Ambulatory Visit: Payer: Self-pay | Admitting: Nurse Practitioner

## 2020-09-01 DIAGNOSIS — E2839 Other primary ovarian failure: Secondary | ICD-10-CM

## 2020-09-03 DIAGNOSIS — Z8742 Personal history of other diseases of the female genital tract: Secondary | ICD-10-CM | POA: Diagnosis not present

## 2020-09-04 DIAGNOSIS — N9489 Other specified conditions associated with female genital organs and menstrual cycle: Secondary | ICD-10-CM | POA: Diagnosis not present

## 2020-09-21 ENCOUNTER — Inpatient Hospital Stay: Payer: BC Managed Care – PPO | Attending: Gynecologic Oncology | Admitting: Gynecologic Oncology

## 2020-09-21 ENCOUNTER — Encounter: Payer: Self-pay | Admitting: Gynecologic Oncology

## 2020-09-21 ENCOUNTER — Inpatient Hospital Stay: Payer: BC Managed Care – PPO

## 2020-09-21 ENCOUNTER — Other Ambulatory Visit: Payer: Self-pay

## 2020-09-21 ENCOUNTER — Other Ambulatory Visit: Payer: Self-pay | Admitting: Gynecologic Oncology

## 2020-09-21 VITALS — BP 136/85 | HR 57 | Temp 98.8°F | Resp 20 | Ht 72.0 in | Wt 299.0 lb

## 2020-09-21 DIAGNOSIS — F32A Depression, unspecified: Secondary | ICD-10-CM | POA: Diagnosis not present

## 2020-09-21 DIAGNOSIS — N9489 Other specified conditions associated with female genital organs and menstrual cycle: Secondary | ICD-10-CM | POA: Insufficient documentation

## 2020-09-21 DIAGNOSIS — R102 Pelvic and perineal pain: Secondary | ICD-10-CM | POA: Diagnosis not present

## 2020-09-21 DIAGNOSIS — Z79899 Other long term (current) drug therapy: Secondary | ICD-10-CM | POA: Diagnosis not present

## 2020-09-21 DIAGNOSIS — M109 Gout, unspecified: Secondary | ICD-10-CM | POA: Insufficient documentation

## 2020-09-21 DIAGNOSIS — Z780 Asymptomatic menopausal state: Secondary | ICD-10-CM | POA: Insufficient documentation

## 2020-09-21 DIAGNOSIS — K5900 Constipation, unspecified: Secondary | ICD-10-CM | POA: Diagnosis not present

## 2020-09-21 DIAGNOSIS — E785 Hyperlipidemia, unspecified: Secondary | ICD-10-CM | POA: Insufficient documentation

## 2020-09-21 DIAGNOSIS — Z6841 Body Mass Index (BMI) 40.0 and over, adult: Secondary | ICD-10-CM | POA: Diagnosis not present

## 2020-09-21 DIAGNOSIS — I10 Essential (primary) hypertension: Secondary | ICD-10-CM | POA: Diagnosis not present

## 2020-09-21 DIAGNOSIS — R140 Abdominal distension (gaseous): Secondary | ICD-10-CM | POA: Diagnosis not present

## 2020-09-21 DIAGNOSIS — D398 Neoplasm of uncertain behavior of other specified female genital organs: Secondary | ICD-10-CM | POA: Diagnosis not present

## 2020-09-21 DIAGNOSIS — E66813 Obesity, class 3: Secondary | ICD-10-CM | POA: Insufficient documentation

## 2020-09-21 DIAGNOSIS — R6881 Early satiety: Secondary | ICD-10-CM | POA: Insufficient documentation

## 2020-09-21 LAB — COMPREHENSIVE METABOLIC PANEL
ALT: 11 U/L (ref 0–44)
AST: 14 U/L — ABNORMAL LOW (ref 15–41)
Albumin: 3.7 g/dL (ref 3.5–5.0)
Alkaline Phosphatase: 147 U/L — ABNORMAL HIGH (ref 38–126)
Anion gap: 8 (ref 5–15)
BUN: 18 mg/dL (ref 8–23)
CO2: 29 mmol/L (ref 22–32)
Calcium: 9.8 mg/dL (ref 8.9–10.3)
Chloride: 103 mmol/L (ref 98–111)
Creatinine, Ser: 0.97 mg/dL (ref 0.44–1.00)
GFR, Estimated: >60 mL/min (ref >60)
Glucose, Bld: 91 mg/dL (ref 70–99)
Potassium: 4.5 mmol/L (ref 3.5–5.1)
Sodium: 140 mmol/L (ref 135–145)
Total Bilirubin: 0.4 mg/dL (ref 0.3–1.2)
Total Protein: 7.9 g/dL (ref 6.5–8.1)

## 2020-09-21 LAB — CEA (IN HOUSE-CHCC): CEA (CHCC-In House): 2.05 ng/mL (ref 0.00–5.00)

## 2020-09-21 MED ORDER — SENNOSIDES-DOCUSATE SODIUM 8.6-50 MG PO TABS: 2.0000 | ORAL_TABLET | Freq: Every day | ORAL | 0 refills | Status: AC

## 2020-09-21 MED ORDER — IBUPROFEN 600 MG PO TABS: 600.0000 mg | ORAL_TABLET | Freq: Three times a day (TID) | ORAL | 0 refills | Status: AC | PRN

## 2020-09-21 MED ORDER — TRAMADOL HCL 50 MG PO TABS: 50.0000 mg | ORAL_TABLET | Freq: Four times a day (QID) | ORAL | 0 refills | Status: AC | PRN

## 2020-09-21 NOTE — Progress Notes (Signed)
GYNECOLOGIC ONCOLOGY NEW PATIENT CONSULTATION   Patient Name: Catherine Johnson  Patient Age: 65 y.o. Date of Service: 09/21/20 Referring Provider: Burman Riis, NP  Primary Care Provider: Patient, No Pcp Per (Inactive) Consulting Provider: Jeral Pinch, MD   Assessment/Plan:  Postmenopausal patient with a large complex adnexal mass.  I reviewed recent outside ultrasound findings with the patient and discussed my exam findings today.  She has a large adnexal mass that at least on outside ultrasound is suspicious for being of ovarian origin.  There are some complex features, although it is described as mostly cystic.  Additionally, she had some tumor markers that were normal.  We discussed that both from a therapeutic standpoint secondary to her symptoms but also a diagnostic one, I recommend proceeding with definitive surgery.  The patient was understandably somewhat overwhelmed with her discussion today but was understanding and amenable to planning surgery.  Given size of the mass as well as inability to fully characterize it on outside imaging, I am recommending that we proceed with CT evaluation prior to surgery.  We will tentatively plan for surgery on 7/21.  If risk of malignancy continues to be low, then we discussed the plan for mini laparotomy for mass decompression in a contained manner followed by robotic BSO.  I will plan to send the mass for frozen section at the time of surgery.  If no malignancy is identified, then no further surgery would be indicated.  If a borderline tumor was noted, then I would plan to also perform a total robotic hysterectomy.  In the event of malignancy identified at the time of frozen section, then we discussed additional procedures for staging purposes including possible lymph node sampling, omentectomy, peritoneal biopsies, and any other indicated procedures.  I will call the patient once I have her CT scan results to discuss if this changes our  surgical plan.  The plan will be for a mini laparotomy for cyst decompression, robotic assisted bilateral salpingo-oophorectomy, possible total hysterectomy, possible staging, possible laparotomy, and any other indicated procedures. The risks of surgery were discussed in detail and she understands these to include infection; wound separation; hernia; vaginal cuff separation, injury to adjacent organs such as bowel, bladder, blood vessels, ureters and nerves; bleeding which may require blood transfusion; anesthesia risk; thromboembolic events; possible death; unforeseen complications; possible need for re-exploration; medical complications such as heart attack, stroke, pleural effusion and pneumonia; and, if full lymphadenectomy is performed the risk of lymphedema and lymphocyst. The patient will receive DVT and antibiotic prophylaxis as indicated. She voiced a clear understanding. She had the opportunity to ask questions. Perioperative instructions were reviewed with her. Prescriptions for post-op medications were sent to her pharmacy of choice.  The patient was asked to sign a release of records today.  Given that she was previously followed for an adnexal mass, we will try to obtain records from her prior OB/GYN in [place].  A copy of this note was sent to the patient's referring provider.   80 minutes of total time was spent for this patient encounter, including preparation, face-to-face counseling with the patient and coordination of care, and documentation of the encounter.  Jeral Pinch, MD  Division of Gynecologic Oncology  Department of Obstetrics and Gynecology  [facility]  ___________________________________________  Chief Complaint: Chief Complaint  Patient presents with   Adnexal mass    History of Present Illness:  Catherine Johnson is a 65 y.o. y.o. female who is seen in consultation  at the request of Burman Riis, NP or an evaluation of  an adnexal mass.  The patient reports a history of a right ovarian cyst beginning at least 10 years ago when she was under the care of an OB/GYN in [place] (Dr. Nori Riis).  At that time, she remembers not having symptoms or being bothered by this mass.  She recalls that it could "be felt on exam".  She does not remember ever being told of this size based on ultrasound imaging.    More recently, especially over the last 3 to 4 months, she has began having more noticeable symptoms.  She intermittently feels a "pulling" or "irritation" in her right mid and lower abdomen.  She has difficulty lifting anything heavy now.  She endorses a long history of constipation for which she uses MiraLAX as needed for constipation with good relief.  She has noted worsening of her constipation over the last 3 to 4 months.  She has intermittent pelvic pressure, reports no urinary symptoms.  She endorses a good appetite although describes that she cannot eat quite as much is normal.  She has some early satiety as well as abdominal bloating.  Her baseline weight is right around 300 pounds.  She had gained some recently and weighed about 16 pounds more at the beginning of the year.  She has now back down to her normal baseline weight.  He has noticed some tighter fit to her clothes around the midline.  She denies any lower extremity edema.  Patient was recently seen at [facility] for an annual exam and to establish care there is a new patient.  Based on her symptoms as well as mass palpated on her exam, an ultrasound was obtained showing a large complex abdominal pelvic mass.  CA-125,HE4 and ROMA were all normal.  Patient lives in [place] by herself.  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  She denies any tobacco or alcohol use.  PAST MEDICAL HISTORY:  Past Medical History:  Diagnosis Date   Depression    Gout    History of PCR DNA positive for HSV2    Hyperlipidemia    Hypertension     Obesity, Class III, BMI 40-49.9 (morbid obesity) ([place])      PAST SURGICAL HISTORY:  Past Surgical History:  Procedure Laterality Date   BREAST BIOPSY     CESAREAN SECTION     2 previous   COLONOSCOPY  07/2019    OB/GYN HISTORY:  OB History  Gravida Para Term Preterm AB Living  2 2       2   SAB IAB Ectopic Multiple Live Births          2    # Outcome Date GA Lbr Len/2nd Weight Sex Delivery Anes PTL Lv  2 Para           1 Para             No LMP recorded. Patient is postmenopausal.  Age at menarche: 76 Age at menopause: 53, patient denies any postmenopausal bleeding Hx of HRT: Denies Hx of STDs: Yes, patient recently tested positive for HSV-2 and was treated for her first outbreak Last pap: 02/2018 negative, HPV not detected History of abnormal pap smears: Yes, has a remote history between the ages of 15 and 4 of an abnormal Pap smear treated with cryotherapy.  She reports normal Pap smears after that.  SCREENING STUDIES:  Last mammogram: 01/2020  Last colonoscopy:  2021, plan for repeat in 3 years  MEDICATIONS: Outpatient Encounter Medications as of 09/21/2020  Medication Sig   allopurinol (ZYLOPRIM) 300 MG tablet Take 300 mg by mouth daily.   atorvastatin (LIPITOR) 10 MG tablet Take 10 mg by mouth daily.   benazepril (LOTENSIN) 40 MG tablet Take 40 mg by mouth daily.   Cholecalciferol (VITAMIN D3) 1.25 MG (50000 UT) CAPS Take 1 capsule by mouth once a week.   hydrochlorothiazide (HYDRODIURIL) 25 MG tablet Take 25 mg by mouth daily.   ibuprofen (ADVIL) 600 MG tablet Take 1 tablet (600 mg total) by mouth every 8 (eight) hours as needed for moderate pain. For AFTER surgery only   metoprolol tartrate (LOPRESSOR) 50 MG tablet Take 50 mg by mouth 2 (two) times daily.   senna-docusate (SENOKOT-S) 8.6-50 MG tablet Take 2 tablets by mouth at bedtime. For AFTER surgery, do not take if having diarrhea   sertraline (ZOLOFT) 25 MG tablet Take 25 mg by mouth daily.   traMADol  (ULTRAM) 50 MG tablet Take 1 tablet (50 mg total) by mouth every 6 (six) hours as needed for severe pain. For AFTER surgery only, do not take and drive   [DISCONTINUED] diazepam (VALIUM) 5 MG tablet Take 1 tablet (5 mg total) by mouth every 12 (twelve) hours as needed for anxiety or muscle spasms. (Patient not taking: Reported on 10/31/2017)   [DISCONTINUED] HYDROcodone-acetaminophen (NORCO/VICODIN) 5-325 MG tablet Take 1-2 tablets by mouth every 6 (six) hours as needed for moderate pain. (Patient not taking: Reported on 10/31/2017)   [DISCONTINUED] lidocaine (LIDODERM) 5 % Place 1 patch onto the skin daily. Remove & Discard patch within 12 hours (Patient not taking: Reported on 10/31/2017)   [DISCONTINUED] Multiple Vitamin (MULTIVITAMIN WITH MINERALS) TABS tablet Take 1 tablet by mouth daily.   [DISCONTINUED] naproxen (NAPROSYN) 500 MG tablet Take 1 tablet (500 mg total) by mouth 2 (two) times daily. (Patient not taking: Reported on 10/31/2017)   [DISCONTINUED] promethazine (PHENERGAN) 25 MG tablet Take 1 tablet (25 mg total) by mouth every 6 (six) hours as needed for nausea or vomiting. (Patient not taking: Reported on 10/31/2017)   [DISCONTINUED] tetrahydrozoline 0.05 % ophthalmic solution Place 1 drop into both eyes as needed (dry eyes).   No facility-administered encounter medications on file as of 09/21/2020.    ALLERGIES:  Allergies  Allergen Reactions   Penicillins Hives     FAMILY HISTORY:  Family History  Problem Relation Age of Onset   Diabetes Mother    Hypertension Mother    Hypertension Sister    Colon cancer Neg Hx    Breast cancer Neg Hx    Ovarian cancer Neg Hx    Endometrial cancer Neg Hx    Pancreatic cancer Neg Hx    Prostate cancer Neg Hx      SOCIAL HISTORY:  Social Connections: Not on file    REVIEW OF SYSTEMS:  Reports constipation, pelvic pain, joint pain, anxiety, depression. Denies fevers, chills, fatigue, unexplained weight changes. Denies hearing loss,  neck lumps or masses, mouth sores, ringing in ears or voice changes. Denies cough or wheezing.  Denies shortness of breath. Denies chest pain or palpitations. Denies leg swelling. Denies abdominal distention, blood in stools, diarrhea, nausea, vomiting. Denies pain with intercourse, dysuria, frequency, hematuria or incontinence. Denies hot flashes, vaginal bleeding or vaginal discharge.   Denies back pain or muscle pain/cramps. Denies itching, rash, or wounds. Denies dizziness, headaches, numbness or seizures. Denies swollen lymph nodes or glands, denies easy bruising  or bleeding. Denies confusion or decreased concentration.  Physical Exam:  Vital Signs for this encounter:  Blood pressure 136/85, pulse (!) 57, temperature 98.8 F (37.1 C), temperature source Oral, resp. rate 20, height 6' (1.829 m), weight 299 lb (135.6 kg), SpO2 100 %. Body mass index is 40.55 kg/m. General: Alert, oriented, no acute distress.  HEENT: Normocephalic, atraumatic. Sclera anicteric.  Chest: Clear to auscultation bilaterally. No wheezes, rhonchi, or rales. Cardiovascular: Regular rate and rhythm, no murmurs, rubs, or gallops.  Abdomen: Obese. Normoactive bowel sounds. Soft, nondistended, nontender to palpation. No masses or hepatosplenomegaly appreciated. No palpable fluid wave.  Well-healed Pfannenstiel incision. Extremities: Grossly normal range of motion. Warm, well perfused. No edema bilaterally.  Skin: No rashes or lesions.  Lymphatics: No cervical, supraclavicular, or inguinal adenopathy.  GU:  Normal external female genitalia. No lesions. No discharge or bleeding.             Bladder/urethra:  No lesions or masses, well supported bladder             Vagina: Mildly atrophic, no lesions or masses.             Cervix: Normal appearing, no lesions.             Uterus/adnexa: There is minimal movement of the uterus and adnexa on bimanual exam.  Somewhat difficult to distinguish 1 from the other but with  the abdominal hand, there is a mass on the right and in the midline that spans to just above the umbilicus.  Patient's body habitus limits distinguishing whether this is adnexal or uterine in origin.  The mass itself feels somewhat firm on palpation with the abdominal hand.  Rectal: No nodularity.  LABORATORY AND RADIOLOGIC DATA:  Outside medical records were reviewed to synthesize the above history, along with the history and physical obtained during the visit.   Lab Results  Component Value Date   WBC 5.8 05/15/2017   HGB 13.1 05/15/2017   HCT 39.0 05/15/2017   PLT 223 05/15/2017   GLUCOSE 98 05/15/2017   NA 140 05/15/2017   K 4.5 05/15/2017   CL 107 05/15/2017   CREATININE 1.21 (H) 05/15/2017   BUN 24 (H) 05/15/2017   CO2 23 05/15/2017   CA-120 516/25: 11.9 HE4: 85.9 Postmenopausal Roma: 1.68 (low)  Transabdominal and transvaginal pelvic ultrasound at [facility] on 6/23: Uterus difficult to clearly visualize but measured at 7.5 x 2.6 x 3.9 cm.  No discrete masses seen.  Endometrium not clearly visualized.  Neither ovary visualized either transvaginally or transabdominally.  There is a large, complex, multiseptated mass seen in the right adnexa that measures 20.1 x 17.7 x 12.1 cm.  No blood flow seen within the mass.

## 2020-09-21 NOTE — Patient Instructions (Addendum)
Plan to have a CT scan prior to surgery and Dr. Berline Lopes will contact you with the results. Today we will check your kidney function prior to having contrast for the CT scan and we will also check a tumor marker called CEA and notify you with the results.   Preparing for your Surgery  Plan for surgery on October 01, 2020 with Dr. Jeral Pinch at [place] will be scheduled for a mini laparotomy for cyst decompression (larger incision on your abdomen to decompress the cyst), robotic assisted laparoscopic bilateral salpingo-oophorectomy (removal of both ovaries and fallopian tubes), possible robotic assisted total hysterectomy (removal of the uterus and cervix), possible staging if a cancer is identified, possible laparotomy (larger incision if needed).   Pre-operative Testing -You will receive a phone call from presurgical testing at [facility] to arrange for a pre-operative appointment and lab work.  -Bring your insurance card, copy of an advanced directive if applicable, medication list  -At that visit, you will be asked to sign a consent for a possible blood transfusion in case a transfusion becomes necessary during surgery.  The need for a blood transfusion is rare but having consent is a necessary part of your care.     -You should not be taking blood thinners or aspirin at least ten days prior to surgery unless instructed by your surgeon.  -Do not take supplements such as fish oil (omega 3), red yeast rice, turmeric before your surgery. You want to avoid medications with aspirin in them including headache powders such as BC or Goody's), Excedrin migraine.  Day Before Surgery at [place] will be asked to take in a light diet the day before surgery. You will be advised you can have clear liquids up until 3 hours before your surgery.    Eat a light diet the day before surgery.  Examples including soups, broths, toast, yogurt, mashed potatoes.  AVOID GAS PRODUCING  FOODS. Things to avoid include carbonated beverages (fizzy beverages, sodas), raw fruits and raw vegetables (uncooked), or beans.   If your bowels are filled with gas, your surgeon will have difficulty visualizing your pelvic organs which increases your surgical risks.  Your role in recovery Your role is to become active as soon as directed by your doctor, while still giving yourself time to heal.  Rest when you feel tired. You will be asked to do the following in order to speed your recovery:  - Cough and breathe deeply. This helps to clear and expand your lungs and can prevent pneumonia after surgery.  - [place]. Do mild physical activity. Walking or moving your legs help your circulation and body functions return to normal. Do not try to get up or walk alone the first time after surgery.   -If you develop swelling on one leg or the other, pain in the back of your leg, redness/warmth in one of your legs, please call the office or go to the Emergency Room to have a doppler to rule out a blood clot. For shortness of breath, chest pain-seek care in the Emergency Room as soon as possible. - Actively manage your pain. Managing your pain lets you move in comfort. We will ask you to rate your pain on a scale of zero to 10. It is your responsibility to tell your doctor or nurse where and how much you hurt so your pain can be treated.  Special Considerations -If you are diabetic, you may be  placed on insulin after surgery to have closer control over your blood sugars to promote healing and recovery.  This does not mean that you will be discharged on insulin.  If applicable, your oral antidiabetics will be resumed when you are tolerating a solid diet.  -Your final pathology results from surgery should be available around one week after surgery and the results will be relayed to you when available.  -Dr. Lahoma Crocker is the surgeon that assists your GYN Oncologist with surgery.   If you end up staying the night, the next day after your surgery you will either see Dr. Denman George, Dr. Berline Lopes, or Dr. Lahoma Crocker.  -FMLA forms can be faxed to [phone] and please allow 5-7 business days for completion.  Pain Management After Surgery -You have been prescribed your pain medication and bowel regimen medications before surgery so that you can have these available when you are discharged from the hospital. The pain medication is for use ONLY AFTER surgery and a new prescription will not be given.   -Make sure that you have Tylenol and Ibuprofen at home to use on a regular basis after surgery for pain control. We recommend alternating the medications every hour to six hours since they work differently and are processed in the body differently for pain relief.  -Review the attached handout on narcotic use and their risks and side effects.   Bowel Regimen -You have been prescribed Sennakot-S to take nightly to prevent constipation especially if you are taking the narcotic pain medication intermittently.  It is important to prevent constipation and drink adequate amounts of liquids. You can stop taking this medication when you are not taking pain medication and you are back on your normal bowel routine.  Risks of Surgery Risks of surgery are low but include bleeding, infection, damage to surrounding structures, re-operation, blood clots, and very rarely death.   Blood Transfusion Information (For the consent to be signed before surgery)  We will be checking your blood type before surgery so in case of emergencies, we will know what type of blood you would need.                                            WHAT IS A BLOOD TRANSFUSION?  A transfusion is the replacement of blood or some of its parts. Blood is made up of multiple cells which provide different functions. Red blood cells carry oxygen and are used for blood loss replacement. White blood cells fight against  infection. Platelets control bleeding. Plasma helps clot blood. Other blood products are available for specialized needs, such as hemophilia or other clotting disorders. BEFORE THE TRANSFUSION  Who gives blood for transfusions?  You may be able to donate blood to be used at a later date on yourself (autologous donation). Relatives can be asked to donate blood. This is generally not any safer than if you have received blood from a stranger. The same precautions are taken to ensure safety when a relative's blood is donated. Healthy volunteers who are fully evaluated to make sure their blood is safe. This is blood bank blood. Transfusion therapy is the safest it has ever been in the practice of medicine. Before blood is taken from a donor, a complete history is taken to make sure that person has no history of diseases nor engages in risky social behavior (examples are intravenous  drug use or sexual activity with multiple partners). The donor's travel history is screened to minimize risk of transmitting infections, such as malaria. The donated blood is tested for signs of infectious diseases, such as HIV and hepatitis. The blood is then tested to be sure it is compatible with you in order to minimize the chance of a transfusion reaction. If you or a relative donates blood, this is often done in anticipation of surgery and is not appropriate for emergency situations. It takes many days to process the donated blood. RISKS AND COMPLICATIONS Although transfusion therapy is very safe and saves many lives, the main dangers of transfusion include:  Getting an infectious disease. Developing a transfusion reaction. This is an allergic reaction to something in the blood you were given. Every precaution is taken to prevent this. The decision to have a blood transfusion has been considered carefully by your caregiver before blood is given. Blood is not given unless the benefits outweigh the risks.  AFTER SURGERY  INSTRUCTIONS  Return to work: 4 weeks if applicable  Activity: 1. Be up and out of the bed during the day.  Take a nap if needed.  You may walk up steps but be careful and use the hand rail.  Stair climbing will tire you more than you think, you may need to stop part way and rest.   2. No lifting or straining for 6 weeks over 10 pounds. No pushing, pulling, straining for 6 weeks.  3. No driving for 1 week(s).  Do not drive if you are taking narcotic pain medicine and make sure that your reaction time has returned.   4. You can shower as soon as the next day after surgery. Shower daily.  Use your regular soap and water (not directly on the incision) and pat your incision(s) dry afterwards; don't rub.  No tub baths or submerging your body in water until cleared by your surgeon. If you have the soap that was given to you by pre-surgical testing that was used before surgery, you do not need to use it afterwards because this can irritate your incisions.   5. No sexual activity and nothing in the vagina for 4 weeks (8 weeks if you have a hysterectomy).  6. You may experience a small amount of clear drainage from your incisions, which is normal.  If the drainage persists, increases, or changes color please call the office.  7. Do not use creams, lotions, or ointments such as neosporin on your incisions after surgery until advised by your surgeon because they can cause removal of the dermabond glue on your incisions.    8. You may experience vaginal spotting after surgery or around the 6-8 week mark from surgery when the stitches at the top of the vagina begin to dissolve (if you have a hysterectomy).  The spotting is normal but if you experience heavy bleeding, call our office.  9. Take Tylenol or ibuprofen first for pain and only use narcotic pain medication for severe pain not relieved by the Tylenol or Ibuprofen.  Monitor your Tylenol intake to a max of 4,000 mg in a 24 hour period. You can  alternate these medications after surgery.  Diet: 1. Low sodium Heart Healthy Diet is recommended but you are cleared to resume your normal (before surgery) diet after your procedure.  2. It is safe to use a laxative, such as Miralax or Colace, if you have difficulty moving your bowels. You have been prescribed Sennakot at bedtime every  evening to keep bowel movements regular and to prevent constipation.    Wound Care: 1. Keep clean and dry.  Shower daily.  Reasons to call the Doctor: Fever - Oral temperature greater than 100.4 degrees Fahrenheit Foul-smelling vaginal discharge Difficulty urinating Nausea and vomiting Increased pain at the site of the incision that is unrelieved with pain medicine. Difficulty breathing with or without chest pain New calf pain especially if only on one side Sudden, continuing increased vaginal bleeding with or without clots.   Contacts: For questions or concerns you should contact:  Dr. Jeral Pinch at [phone]  Joylene John, NP at [phone]  After Hours: call [phone] and have the GYN Oncologist paged/contacted (after 5 pm or on the weekends).  Messages sent via mychart are for non-urgent matters and are not responded to after hours so for urgent needs, please call the after hours number.

## 2020-09-21 NOTE — H&P (View-Only) (Signed)
GYNECOLOGIC ONCOLOGY NEW PATIENT CONSULTATION   Patient Name: Catherine Johnson  Patient Age: 65 y.o. Date of Service: 09/21/20 Referring Provider: Burman Riis, NP  Primary Care Provider: Patient, No Pcp Per (Inactive) Consulting Provider: Jeral Pinch, MD   Assessment/Plan:  Postmenopausal patient with a large complex adnexal mass.  I reviewed recent outside ultrasound findings with the patient and discussed my exam findings today.  She has a large adnexal mass that at least on outside ultrasound is suspicious for being of ovarian origin.  There are some complex features, although it is described as mostly cystic.  Additionally, she had some tumor markers that were normal.  We discussed that both from a therapeutic standpoint secondary to her symptoms but also a diagnostic one, I recommend proceeding with definitive surgery.  The patient was understandably somewhat overwhelmed with her discussion today but was understanding and amenable to planning surgery.  Given size of the mass as well as inability to fully characterize it on outside imaging, I am recommending that we proceed with CT evaluation prior to surgery.  We will tentatively plan for surgery on 7/21.  If risk of malignancy continues to be low, then we discussed the plan for mini laparotomy for mass decompression in a contained manner followed by robotic BSO.  I will plan to send the mass for frozen section at the time of surgery.  If no malignancy is identified, then no further surgery would be indicated.  If a borderline tumor was noted, then I would plan to also perform a total robotic hysterectomy.  In the event of malignancy identified at the time of frozen section, then we discussed additional procedures for staging purposes including possible lymph node sampling, omentectomy, peritoneal biopsies, and any other indicated procedures.  I will call the patient once I have her CT scan results to discuss if this changes our  surgical plan.  The plan will be for a mini laparotomy for cyst decompression, robotic assisted bilateral salpingo-oophorectomy, possible total hysterectomy, possible staging, possible laparotomy, and any other indicated procedures. The risks of surgery were discussed in detail and she understands these to include infection; wound separation; hernia; vaginal cuff separation, injury to adjacent organs such as bowel, bladder, blood vessels, ureters and nerves; bleeding which may require blood transfusion; anesthesia risk; thromboembolic events; possible death; unforeseen complications; possible need for re-exploration; medical complications such as heart attack, stroke, pleural effusion and pneumonia; and, if full lymphadenectomy is performed the risk of lymphedema and lymphocyst. The patient will receive DVT and antibiotic prophylaxis as indicated. She voiced a clear understanding. She had the opportunity to ask questions. Perioperative instructions were reviewed with her. Prescriptions for post-op medications were sent to her pharmacy of choice.  The patient was asked to sign a release of records today.  Given that she was previously followed for an adnexal mass, we will try to obtain records from her prior OB/GYN in [place].  A copy of this note was sent to the patient's referring provider.   80 minutes of total time was spent for this patient encounter, including preparation, face-to-face counseling with the patient and coordination of care, and documentation of the encounter.  Jeral Pinch, MD  Division of Gynecologic Oncology  Department of Obstetrics and Gynecology  [facility]  ___________________________________________  Chief Complaint: Chief Complaint  Patient presents with   Adnexal mass    History of Present Illness:  Catherine Johnson is a 65 y.o. y.o. female who is seen in consultation  at the request of Burman Riis, NP or an evaluation of  an adnexal mass.  The patient reports a history of a right ovarian cyst beginning at least 10 years ago when she was under the care of an OB/GYN in [place] (Dr. Nori Johnson).  At that time, she remembers not having symptoms or being bothered by this mass.  She recalls that it could "be felt on exam".  She does not remember ever being told of this size based on ultrasound imaging.    More recently, especially over the last 3 to 4 months, she has began having more noticeable symptoms.  She intermittently feels a "pulling" or "irritation" in her right mid and lower abdomen.  She has difficulty lifting anything heavy now.  She endorses a long history of constipation for which she uses MiraLAX as needed for constipation with good relief.  She has noted worsening of her constipation over the last 3 to 4 months.  She has intermittent pelvic pressure, reports no urinary symptoms.  She endorses a good appetite although describes that she cannot eat quite as much is normal.  She has some early satiety as well as abdominal bloating.  Her baseline weight is right around 300 pounds.  She had gained some recently and weighed about 16 pounds more at the beginning of the year.  She has now back down to her normal baseline weight.  He has noticed some tighter fit to her clothes around the midline.  She denies any lower extremity edema.  Patient was recently seen at [place] for an annual exam and to establish care there is a new patient.  Based on her symptoms as well as mass palpated on her exam, an ultrasound was obtained showing a large complex abdominal pelvic mass.  CA-125,HE4 and ROMA were all normal.  Patient lives in [place] by herself.  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  She denies any tobacco or alcohol use.  PAST MEDICAL HISTORY:  Past Medical History:  Diagnosis Date   Depression    Gout    History of PCR DNA positive for HSV2    Hyperlipidemia    Hypertension     Obesity, Class III, BMI 40-49.9 (morbid obesity) ([place])      PAST SURGICAL HISTORY:  Past Surgical History:  Procedure Laterality Date   BREAST BIOPSY     CESAREAN SECTION     2 previous   COLONOSCOPY  07/2019    OB/GYN HISTORY:  OB History  Gravida Para Term Preterm AB Living  2 2       2   SAB IAB Ectopic Multiple Live Births          2    # Outcome Date GA Lbr Len/2nd Weight Sex Delivery Anes PTL Lv  2 Para           1 Para             No LMP recorded. Patient is postmenopausal.  Age at menarche: 66 Age at menopause: 12, patient denies any postmenopausal bleeding Hx of HRT: Denies Hx of STDs: Yes, patient recently tested positive for HSV-2 and was treated for her first outbreak Last pap: 02/2018 negative, HPV not detected History of abnormal pap smears: Yes, has a remote history between the ages of 89 and 43 of an abnormal Pap smear treated with cryotherapy.  She reports normal Pap smears after that.  SCREENING STUDIES:  Last mammogram: 01/2020  Last colonoscopy:  2021, plan for repeat in 3 years  MEDICATIONS: Outpatient Encounter Medications as of 09/21/2020  Medication Sig   allopurinol (ZYLOPRIM) 300 MG tablet Take 300 mg by mouth daily.   atorvastatin (LIPITOR) 10 MG tablet Take 10 mg by mouth daily.   benazepril (LOTENSIN) 40 MG tablet Take 40 mg by mouth daily.   Cholecalciferol (VITAMIN D3) 1.25 MG (50000 UT) CAPS Take 1 capsule by mouth once a week.   hydrochlorothiazide (HYDRODIURIL) 25 MG tablet Take 25 mg by mouth daily.   ibuprofen (ADVIL) 600 MG tablet Take 1 tablet (600 mg total) by mouth every 8 (eight) hours as needed for moderate pain. For AFTER surgery only   metoprolol tartrate (LOPRESSOR) 50 MG tablet Take 50 mg by mouth 2 (two) times daily.   senna-docusate (SENOKOT-S) 8.6-50 MG tablet Take 2 tablets by mouth at bedtime. For AFTER surgery, do not take if having diarrhea   sertraline (ZOLOFT) 25 MG tablet Take 25 mg by mouth daily.   traMADol  (ULTRAM) 50 MG tablet Take 1 tablet (50 mg total) by mouth every 6 (six) hours as needed for severe pain. For AFTER surgery only, do not take and drive   [DISCONTINUED] diazepam (VALIUM) 5 MG tablet Take 1 tablet (5 mg total) by mouth every 12 (twelve) hours as needed for anxiety or muscle spasms. (Patient not taking: Reported on 10/31/2017)   [DISCONTINUED] HYDROcodone-acetaminophen (NORCO/VICODIN) 5-325 MG tablet Take 1-2 tablets by mouth every 6 (six) hours as needed for moderate pain. (Patient not taking: Reported on 10/31/2017)   [DISCONTINUED] lidocaine (LIDODERM) 5 % Place 1 patch onto the skin daily. Remove & Discard patch within 12 hours (Patient not taking: Reported on 10/31/2017)   [DISCONTINUED] Multiple Vitamin (MULTIVITAMIN WITH MINERALS) TABS tablet Take 1 tablet by mouth daily.   [DISCONTINUED] naproxen (NAPROSYN) 500 MG tablet Take 1 tablet (500 mg total) by mouth 2 (two) times daily. (Patient not taking: Reported on 10/31/2017)   [DISCONTINUED] promethazine (PHENERGAN) 25 MG tablet Take 1 tablet (25 mg total) by mouth every 6 (six) hours as needed for nausea or vomiting. (Patient not taking: Reported on 10/31/2017)   [DISCONTINUED] tetrahydrozoline 0.05 % ophthalmic solution Place 1 drop into both eyes as needed (dry eyes).   No facility-administered encounter medications on file as of 09/21/2020.    ALLERGIES:  Allergies  Allergen Reactions   Penicillins Hives     FAMILY HISTORY:  Family History  Problem Relation Age of Onset   Diabetes Mother    Hypertension Mother    Hypertension Sister    Colon cancer Neg Hx    Breast cancer Neg Hx    Ovarian cancer Neg Hx    Endometrial cancer Neg Hx    Pancreatic cancer Neg Hx    Prostate cancer Neg Hx      SOCIAL HISTORY:  Social Connections: Not on file    REVIEW OF SYSTEMS:  Reports constipation, pelvic pain, joint pain, anxiety, depression. Denies fevers, chills, fatigue, unexplained weight changes. Denies hearing loss,  neck lumps or masses, mouth sores, ringing in ears or voice changes. Denies cough or wheezing.  Denies shortness of breath. Denies chest pain or palpitations. Denies leg swelling. Denies abdominal distention, blood in stools, diarrhea, nausea, vomiting. Denies pain with intercourse, dysuria, frequency, hematuria or incontinence. Denies hot flashes, vaginal bleeding or vaginal discharge.   Denies back pain or muscle pain/cramps. Denies itching, rash, or wounds. Denies dizziness, headaches, numbness or seizures. Denies swollen lymph nodes or glands, denies easy bruising  or bleeding. Denies confusion or decreased concentration.  Physical Exam:  Vital Signs for this encounter:  Blood pressure 136/85, pulse (!) 57, temperature 98.8 F (37.1 C), temperature source Oral, resp. rate 20, height 6' (1.829 m), weight 299 lb (135.6 kg), SpO2 100 %. Body mass index is 40.55 kg/m. General: Alert, oriented, no acute distress.  HEENT: Normocephalic, atraumatic. Sclera anicteric.  Chest: Clear to auscultation bilaterally. No wheezes, rhonchi, or rales. Cardiovascular: Regular rate and rhythm, no murmurs, rubs, or gallops.  Abdomen: Obese. Normoactive bowel sounds. Soft, nondistended, nontender to palpation. No masses or hepatosplenomegaly appreciated. No palpable fluid wave.  Well-healed Pfannenstiel incision. Extremities: Grossly normal range of motion. Warm, well perfused. No edema bilaterally.  Skin: No rashes or lesions.  Lymphatics: No cervical, supraclavicular, or inguinal adenopathy.  GU:  Normal external female genitalia. No lesions. No discharge or bleeding.             Bladder/urethra:  No lesions or masses, well supported bladder             Vagina: Mildly atrophic, no lesions or masses.             Cervix: Normal appearing, no lesions.             Uterus/adnexa: There is minimal movement of the uterus and adnexa on bimanual exam.  Somewhat difficult to distinguish 1 from the other but with  the abdominal hand, there is a mass on the right and in the midline that spans to just above the umbilicus.  Patient's body habitus limits distinguishing whether this is adnexal or uterine in origin.  The mass itself feels somewhat firm on palpation with the abdominal hand.  Rectal: No nodularity.  LABORATORY AND RADIOLOGIC DATA:  Outside medical records were reviewed to synthesize the above history, along with the history and physical obtained during the visit.   Lab Results  Component Value Date   WBC 5.8 05/15/2017   HGB 13.1 05/15/2017   HCT 39.0 05/15/2017   PLT 223 05/15/2017   GLUCOSE 98 05/15/2017   NA 140 05/15/2017   K 4.5 05/15/2017   CL 107 05/15/2017   CREATININE 1.21 (H) 05/15/2017   BUN 24 (H) 05/15/2017   CO2 23 05/15/2017   CA-120 516/25: 11.9 HE4: 85.9 Postmenopausal [place]: 1.68 (low)  Transabdominal and transvaginal pelvic ultrasound at [facility] on 6/23: Uterus difficult to clearly visualize but measured at 7.5 x 2.6 x 3.9 cm.  No discrete masses seen.  Endometrium not clearly visualized.  Neither ovary visualized either transvaginally or transabdominally.  There is a large, complex, multiseptated mass seen in the right adnexa that measures 20.1 x 17.7 x 12.1 cm.  No blood flow seen within the mass.

## 2020-09-22 ENCOUNTER — Ambulatory Visit: Payer: No Typology Code available for payment source | Admitting: Gynecologic Oncology

## 2020-09-23 NOTE — Progress Notes (Signed)
DUE TO COVID-19 ONLY ONE VISITOR IS ALLOWED TO COME WITH YOU AND STAY IN THE WAITING ROOM ONLY DURING PRE OP AND PROCEDURE DAY OF SURGERY. THE 1 VISITOR  MAY VISIT WITH YOU AFTER SURGERY IN YOUR PRIVATE ROOM DURING VISITING HOURS ONLY!  YOU NEED TO HAVE A COVID 19 TEST ON_______ @_______ , THIS TEST MUST BE DONE BEFORE SURGERY,  COVID TESTING SITE [place] [address], IT IS ON THE RIGHT GOING OUT [address] APPROXIMATELY  2 MINUTES PAST [employer] ON THE RIGHT. ONCE YOUR COVID TEST IS COMPLETED,  PLEASE BEGIN THE QUARANTINE INSTRUCTIONS AS OUTLINED IN YOUR HANDOUT.                Catherine Johnson  09/23/2020   Your procedure is scheduled on:       10/01/20   Report to [facility] Main  Entrance   Report to admitting at   [place] AM     Call this number if you have problems the morning of surgery [phone]    Remember: Do not eat food , candy gum or mints :After Midnight. You may have clear liquids from midnight until  0430am .  Eat a light diet the day before surgery.  Avid gas producing foods.     CLEAR LIQUID DIET   Foods Allowed                                                                       Coffee and tea, regular and decaf                              Plain Jell-O any favor except red or purple                                            Fruit ices (not with fruit pulp)                                      Iced Popsicles                                     Carbonated beverages, regular and diet                                    Cranberry, grape and apple juices Sports drinks like Gatorade Lightly seasoned clear broth or consume(fat free) Sugar, honey syrup   _____________________________________________________________________    BRUSH YOUR TEETH MORNING OF SURGERY AND RINSE YOUR MOUTH OUT, NO CHEWING GUM CANDY OR MINTS.     Take these medicines the morning of surgery with A SIP OF WATER:  zoloft, allopurinol,  metoprolol   DO NOT TAKE ANY DIABETIC MEDICATIONS DAY OF YOUR SURGERY  You may not have any metal on your body including hair pins and              piercings  Do not wear jewelry, make-up, lotions, powders or perfumes, deodorant             Do not wear nail polish on your fingernails.  Do not shave  48 hours prior to surgery.              Men may shave face and neck.   Do not bring valuables to the hospital. [place].  Contacts, dentures or bridgework may not be worn into surgery.  Leave suitcase in the car. After surgery it may be brought to your room.     Patients discharged the day of surgery will not be allowed to drive home. IF YOU ARE HAVING SURGERY AND GOING HOME THE SAME DAY, YOU MUST HAVE AN ADULT TO DRIVE YOU HOME AND BE WITH YOU FOR 24 HOURS. YOU MAY GO HOME BY TAXI OR UBER OR ORTHERWISE, BUT AN ADULT MUST ACCOMPANY YOU HOME AND STAY WITH YOU FOR 24 HOURS.  Name and phone number of your driver:  Special Instructions: N/A              Please read over the following fact sheets you were given: _____________________________________________________________________  [facility] - Preparing for Surgery Before surgery, you can play an important role.  Because skin is not sterile, your skin needs to be as free of germs as possible.  You can reduce the number of germs on your skin by washing with CHG (chlorahexidine gluconate) soap before surgery.  CHG is an antiseptic cleaner which kills germs and bonds with the skin to continue killing germs even after washing. Please DO NOT use if you have an allergy to CHG or antibacterial soaps.  If your skin becomes reddened/irritated stop using the CHG and inform your nurse when you arrive at Short Stay. Do not shave (including legs and underarms) for at least 48 hours prior to the first CHG shower.  You may shave your face/neck. Please follow these instructions  carefully:  1.  Shower with CHG Soap the night before surgery and the  morning of Surgery.  2.  If you choose to wash your hair, wash your hair first as usual with your  normal  shampoo.  3.  After you shampoo, rinse your hair and body thoroughly to remove the  shampoo.                           4.  Use CHG as you would any other liquid soap.  You can apply chg directly  to the skin and wash                       Gently with a scrungie or clean washcloth.  5.  Apply the CHG Soap to your body ONLY FROM THE NECK DOWN.   Do not use on face/ open                           Wound or open sores. Avoid contact with eyes, ears mouth and genitals (private parts).  Wash face,  Genitals (private parts) with your normal soap.             6.  Wash thoroughly, paying special attention to the area where your surgery  will be performed.  7.  Thoroughly rinse your body with warm water from the neck down.  8.  DO NOT shower/wash with your normal soap after using and rinsing off  the CHG Soap.                9.  Pat yourself dry with a clean towel.            10.  Wear clean pajamas.            11.  Place clean sheets on your bed the night of your first shower and do not  sleep with pets. Day of Surgery : Do not apply any lotions/deodorants the morning of surgery.  Please wear clean clothes to the hospital/surgery center.  FAILURE TO FOLLOW THESE INSTRUCTIONS MAY RESULT IN THE CANCELLATION OF YOUR SURGERY PATIENT SIGNATURE_________________________________  NURSE SIGNATURE__________________________________  ________________________________________________________________________

## 2020-09-28 ENCOUNTER — Encounter (HOSPITAL_COMMUNITY)
Admission: RE | Admit: 2020-09-28 | Discharge: 2020-09-28 | Disposition: A | Discharge status: Home / Self Care | Payer: BC Managed Care – PPO | Source: Ambulatory Visit | Attending: Gynecologic Oncology | Admitting: Gynecologic Oncology

## 2020-09-28 ENCOUNTER — Other Ambulatory Visit: Payer: Self-pay

## 2020-09-28 ENCOUNTER — Encounter (HOSPITAL_COMMUNITY): Payer: Self-pay

## 2020-09-28 DIAGNOSIS — Z880 Allergy status to penicillin: Secondary | ICD-10-CM | POA: Diagnosis not present

## 2020-09-28 DIAGNOSIS — Z01818 Encounter for other preprocedural examination: Secondary | ICD-10-CM | POA: Insufficient documentation

## 2020-09-28 DIAGNOSIS — R1900 Intra-abdominal and pelvic swelling, mass and lump, unspecified site: Secondary | ICD-10-CM | POA: Diagnosis not present

## 2020-09-28 DIAGNOSIS — R140 Abdominal distension (gaseous): Secondary | ICD-10-CM | POA: Diagnosis not present

## 2020-09-28 DIAGNOSIS — Z79899 Other long term (current) drug therapy: Secondary | ICD-10-CM | POA: Diagnosis not present

## 2020-09-28 DIAGNOSIS — D181 Lymphangioma, any site: Secondary | ICD-10-CM | POA: Diagnosis not present

## 2020-09-28 DIAGNOSIS — R6881 Early satiety: Secondary | ICD-10-CM | POA: Diagnosis not present

## 2020-09-28 DIAGNOSIS — K5900 Constipation, unspecified: Secondary | ICD-10-CM | POA: Diagnosis not present

## 2020-09-28 HISTORY — DX: Anxiety disorder, unspecified: F41.9

## 2020-09-28 HISTORY — DX: Gastro-esophageal reflux disease without esophagitis: K21.9

## 2020-09-28 LAB — CBC
HCT: 39.5 % (ref 36.0–46.0)
Hemoglobin: 12.9 g/dL (ref 12.0–15.0)
MCH: 30.4 pg (ref 26.0–34.0)
MCHC: 32.7 g/dL (ref 30.0–36.0)
MCV: 93.2 fL (ref 80.0–100.0)
Platelets: 229 10*3/uL (ref 150–400)
RBC: 4.24 MIL/uL (ref 3.87–5.11)
RDW: 15.3 % (ref 11.5–15.5)
WBC: 7 10*3/uL (ref 4.0–10.5)
nRBC: 0 % (ref 0.0–0.2)

## 2020-09-28 LAB — URINALYSIS, ROUTINE W REFLEX MICROSCOPIC
Bilirubin Urine: NEGATIVE
Glucose, UA: NEGATIVE mg/dL
Hgb urine dipstick: NEGATIVE
Ketones, ur: 5 mg/dL — AB
Leukocytes,Ua: NEGATIVE
Nitrite: NEGATIVE
Protein, ur: NEGATIVE mg/dL
Specific Gravity, Urine: 1.02 (ref 1.005–1.030)
pH: 5 (ref 5.0–8.0)

## 2020-09-28 NOTE — Progress Notes (Addendum)
Anesthesia Review:  PCP: Sadie Haber at [facility]  Cardiologist : none  Chest x-ray : EKG :09/28/20 Echo : Stress test: Cardiac Cath :  Activity level: can do ao flgiht of stairs without difficulty  Sleep Study/ CPAP : none  Fasting Blood Sugar :      / Checks Blood Sugar -- times a day:   Blood Thinner/ Instructions /Last Dose: ASA / Instructions/ Last Dose :   09/21/20- CMP and CEA in epic.   U/A done 09/28/20 routed to [facility] and [place].

## 2020-09-29 ENCOUNTER — Ambulatory Visit (HOSPITAL_COMMUNITY)
Admission: RE | Admit: 2020-09-29 | Discharge: 2020-09-29 | Disposition: A | Discharge status: Home / Self Care | Payer: BC Managed Care – PPO | Source: Ambulatory Visit | Attending: Gynecologic Oncology | Admitting: Gynecologic Oncology

## 2020-09-29 ENCOUNTER — Encounter (HOSPITAL_COMMUNITY): Payer: Self-pay

## 2020-09-29 DIAGNOSIS — N83201 Unspecified ovarian cyst, right side: Secondary | ICD-10-CM | POA: Diagnosis not present

## 2020-09-29 DIAGNOSIS — N9489 Other specified conditions associated with female genital organs and menstrual cycle: Secondary | ICD-10-CM | POA: Insufficient documentation

## 2020-09-29 DIAGNOSIS — N83202 Unspecified ovarian cyst, left side: Secondary | ICD-10-CM | POA: Diagnosis not present

## 2020-09-29 DIAGNOSIS — N858 Other specified noninflammatory disorders of uterus: Secondary | ICD-10-CM | POA: Diagnosis not present

## 2020-09-29 DIAGNOSIS — I878 Other specified disorders of veins: Secondary | ICD-10-CM | POA: Diagnosis not present

## 2020-09-29 MED ORDER — IOHEXOL 350 MG/ML SOLN
80.0000 mL | Freq: Once | INTRAVENOUS | Status: AC | PRN
  Administered 2020-09-29: 80 mL via INTRAVENOUS

## 2020-09-29 NOTE — Progress Notes (Signed)
Anesthesia Chart Review   Case: [id number] Date/Time: 10/01/20 0715   Procedure: XI ROBOTIC ASSISTED  BILATERAL SALPINGO OOPHORECTOMY POSSIBLE HYSTERECTOMY,POSSIBLE STAGING, POSSIBLE LAPAROTOMY, MINI LAPAROTOMY FOR CYST DECOMPRESSION (Bilateral)   Anesthesia type: General   Pre-op diagnosis: ADNEXAL MASS   Location: [facility] ROOM 02 / [facility]   Surgeons: Lafonda Mosses, MD       DISCUSSION:64 y.o. never smoker with h/o HTN, GERD, adnexal mass scheduled for above procedure 10/01/2020 with Dr. Jeral Pinch.   Elevated blood pressure at PAT visit 139/101. At recent [place] visit 09/21/20 blood pressure 136/85.  Currently on hydrodiuril and metoprolol. Pt denies cv sx, reports she can go up a flight of stairs without sx.  Discussed with Joylene John, NP. Will evaluate DOS, discussed risk of cancellation DOS.  VS: BP (!) 139/101   Pulse 66   Temp 37.1 C (Oral)   Resp 16   Ht 6' (1.829 m)   Wt 136.1 kg   SpO2 100%   BMI 40.69 kg/m   PROVIDERS: [employer] [place] [facility] [place]   LABS: Labs reviewed: Acceptable for surgery. (all labs ordered are listed, but only abnormal results are displayed)  Labs Reviewed  URINALYSIS, ROUTINE W REFLEX MICROSCOPIC - Abnormal; Notable for the following components:      Result Value   APPearance HAZY (*)    Ketones, ur 5 (*)    All other components within normal limits  CBC  TYPE AND SCREEN     IMAGES:   EKG: 09/28/20 Rate 61 bpm  NSR Minimal voltage criteria for LVH, may be normal variant Nonspecific T wave Abnormality    CV:  Past Medical History:  Diagnosis Date   Anxiety    Depression    GERD (gastroesophageal reflux disease)    Gout    History of PCR DNA positive for HSV2    Hyperlipidemia    Hypertension    Obesity, Class III, BMI 40-49.9 (morbid obesity) ([place])     Past Surgical History:  Procedure Laterality Date   BREAST BIOPSY     CESAREAN SECTION     2 previous   COLONOSCOPY  07/2019    MEDICATIONS:   allopurinol (ZYLOPRIM) 300 MG tablet   atorvastatin (LIPITOR) 10 MG tablet   benazepril (LOTENSIN) 40 MG tablet   diclofenac Sodium (VOLTAREN) 1 % GEL   hydrochlorothiazide (HYDRODIURIL) 25 MG tablet   ibuprofen (ADVIL) 600 MG tablet   metoprolol succinate (TOPROL-XL) 50 MG 24 hr tablet   senna-docusate (SENOKOT-S) 8.6-50 MG tablet   sertraline (ZOLOFT) 25 MG tablet   traMADol (ULTRAM) 50 MG tablet   Vitamin D, Ergocalciferol, (DRISDOL) 1.25 MG (50000 UNIT) CAPS capsule   No current facility-administered medications for this encounter.    Konrad Felix, PA-C [facility] Pre-Surgical Testing [phone]

## 2020-09-30 ENCOUNTER — Telehealth: Payer: Self-pay

## 2020-09-30 NOTE — Telephone Encounter (Signed)
Faxed pre-admission testing VS flow sheet to Mr Brake,NP(PCP) for review of elevated BP.

## 2020-09-30 NOTE — Anesthesia Preprocedure Evaluation (Addendum)
Anesthesia Evaluation  Patient identified by MRN, date of birth, ID band Patient awake    Reviewed: Allergy & Precautions, NPO status , Patient's Chart, lab work & pertinent test results, reviewed documented beta blocker date and time   Airway Mallampati: III  TM Distance: >3 FB Neck ROM: Full    Dental  (+) Teeth Intact, Dental Advisory Given   Pulmonary neg pulmonary ROS,    Pulmonary exam normal breath sounds clear to auscultation       Cardiovascular hypertension, Pt. on home beta blockers and Pt. on medications Normal cardiovascular exam Rhythm:Regular Rate:Normal     Neuro/Psych PSYCHIATRIC DISORDERS Anxiety Depression negative neurological ROS     GI/Hepatic Neg liver ROS, GERD  ,  Endo/Other  Morbid obesity  Renal/GU negative Renal ROS     Musculoskeletal  (+) Arthritis , ADNEXAL MASS   Abdominal   Peds  Hematology negative hematology ROS (+)   Anesthesia Other Findings   Reproductive/Obstetrics                            Anesthesia Physical Anesthesia Plan  ASA: 3  Anesthesia Plan: General   Post-op Pain Management:    Induction: Intravenous  PONV Risk Score and Plan: 4 or greater and Midazolam, Dexamethasone, Ondansetron and Scopolamine patch - Pre-op  Airway Management Planned: Oral ETT  Additional Equipment:   Intra-op Plan:   Post-operative Plan: Extubation in OR  Informed Consent: I have reviewed the patients History and Physical, chart, labs and discussed the procedure including the risks, benefits and alternatives for the proposed anesthesia with the patient or authorized representative who has indicated his/her understanding and acceptance.     Dental advisory given  Plan Discussed with: CRNA  Anesthesia Plan Comments: (2nd PIV)       Anesthesia Quick Evaluation

## 2020-09-30 NOTE — Telephone Encounter (Signed)
Telephone call to check on pre-operative status.  Patient compliant with pre-operative instructions.  Reinforced NPO after midnight.  No questions or concerns voiced.  Instructed to call for any needs.   

## 2020-10-01 ENCOUNTER — Encounter (HOSPITAL_COMMUNITY)
Admission: RE | Disposition: A | Discharge status: Home / Self Care | Payer: Self-pay | Source: Home / Self Care | Attending: Gynecologic Oncology

## 2020-10-01 ENCOUNTER — Ambulatory Visit (HOSPITAL_COMMUNITY): Payer: BC Managed Care – PPO | Admitting: Anesthesiology

## 2020-10-01 ENCOUNTER — Ambulatory Visit (HOSPITAL_COMMUNITY): Payer: BC Managed Care – PPO | Admitting: Physician Assistant

## 2020-10-01 ENCOUNTER — Encounter (HOSPITAL_COMMUNITY): Payer: Self-pay | Admitting: Gynecologic Oncology

## 2020-10-01 ENCOUNTER — Ambulatory Visit (HOSPITAL_COMMUNITY)
Admission: RE | Admit: 2020-10-01 | Discharge: 2020-10-01 | Disposition: A | Discharge status: Home / Self Care | Payer: BC Managed Care – PPO | Attending: Gynecologic Oncology | Admitting: Gynecologic Oncology

## 2020-10-01 DIAGNOSIS — I10 Essential (primary) hypertension: Secondary | ICD-10-CM | POA: Diagnosis not present

## 2020-10-01 DIAGNOSIS — Z880 Allergy status to penicillin: Secondary | ICD-10-CM | POA: Insufficient documentation

## 2020-10-01 DIAGNOSIS — F418 Other specified anxiety disorders: Secondary | ICD-10-CM | POA: Diagnosis not present

## 2020-10-01 DIAGNOSIS — N9489 Other specified conditions associated with female genital organs and menstrual cycle: Secondary | ICD-10-CM

## 2020-10-01 DIAGNOSIS — N838 Other noninflammatory disorders of ovary, fallopian tube and broad ligament: Secondary | ICD-10-CM | POA: Diagnosis not present

## 2020-10-01 DIAGNOSIS — R1900 Intra-abdominal and pelvic swelling, mass and lump, unspecified site: Secondary | ICD-10-CM

## 2020-10-01 DIAGNOSIS — R6881 Early satiety: Secondary | ICD-10-CM | POA: Insufficient documentation

## 2020-10-01 DIAGNOSIS — K5900 Constipation, unspecified: Secondary | ICD-10-CM | POA: Insufficient documentation

## 2020-10-01 DIAGNOSIS — D171 Benign lipomatous neoplasm of skin and subcutaneous tissue of trunk: Secondary | ICD-10-CM | POA: Diagnosis not present

## 2020-10-01 DIAGNOSIS — R140 Abdominal distension (gaseous): Secondary | ICD-10-CM | POA: Insufficient documentation

## 2020-10-01 DIAGNOSIS — D181 Lymphangioma, any site: Secondary | ICD-10-CM | POA: Diagnosis not present

## 2020-10-01 DIAGNOSIS — D270 Benign neoplasm of right ovary: Secondary | ICD-10-CM | POA: Diagnosis not present

## 2020-10-01 DIAGNOSIS — Z79899 Other long term (current) drug therapy: Secondary | ICD-10-CM | POA: Insufficient documentation

## 2020-10-01 DIAGNOSIS — E785 Hyperlipidemia, unspecified: Secondary | ICD-10-CM | POA: Diagnosis not present

## 2020-10-01 HISTORY — PX: ROBOTIC ASSISTED SALPINGO OOPHERECTOMY: SHX6082

## 2020-10-01 LAB — TYPE AND SCREEN
ABO/RH(D): O POS
Antibody Screen: NEGATIVE

## 2020-10-01 LAB — ABO/RH: ABO/RH(D): O POS

## 2020-10-01 SURGERY — SALPINGO-OOPHORECTOMY, ROBOT-ASSISTED
Anesthesia: General | Site: Abdomen | Laterality: Bilateral

## 2020-10-01 MED ORDER — DEXAMETHASONE SODIUM PHOSPHATE 10 MG/ML IJ SOLN
INTRAMUSCULAR | Status: DC | PRN
  Administered 2020-10-01: 10 mg via INTRAVENOUS

## 2020-10-01 MED ORDER — CLINDAMYCIN PHOSPHATE 900 MG/50ML IV SOLN
900.0000 mg | INTRAVENOUS | Status: AC
Start: 2020-10-01 — End: 2020-10-01
  Administered 2020-10-01: 900 mg via INTRAVENOUS
  Filled 2020-10-01: qty 50

## 2020-10-01 MED ORDER — ACETAMINOPHEN 325 MG PO TABS: 650.0000 mg | ORAL_TABLET | ORAL | Status: DC | PRN

## 2020-10-01 MED ORDER — SODIUM CHLORIDE 0.9% FLUSH: 3.0000 mL | INTRAVENOUS | Status: DC | PRN

## 2020-10-01 MED ORDER — EPHEDRINE 5 MG/ML INJ
INTRAVENOUS | Status: AC
  Filled 2020-10-01: qty 10

## 2020-10-01 MED ORDER — PROPOFOL 10 MG/ML IV BOLUS
INTRAVENOUS | Status: AC
  Filled 2020-10-01: qty 20

## 2020-10-01 MED ORDER — FENTANYL CITRATE (PF) 100 MCG/2ML IJ SOLN
25.0000 ug | INTRAMUSCULAR | Status: DC | PRN
  Administered 2020-10-01 (×4): 25 ug via INTRAVENOUS

## 2020-10-01 MED ORDER — LACTATED RINGERS IR SOLN
Status: DC | PRN
  Administered 2020-10-01: 1000 mL

## 2020-10-01 MED ORDER — FENTANYL CITRATE (PF) 100 MCG/2ML IJ SOLN
INTRAMUSCULAR | Status: DC | PRN
  Administered 2020-10-01 (×2): 50 ug via INTRAVENOUS
  Administered 2020-10-01: 100 ug via INTRAVENOUS
  Administered 2020-10-01: 50 ug via INTRAVENOUS

## 2020-10-01 MED ORDER — SUGAMMADEX SODIUM 200 MG/2ML IV SOLN
INTRAVENOUS | Status: DC | PRN
  Administered 2020-10-01: 300 mg via INTRAVENOUS

## 2020-10-01 MED ORDER — KETOROLAC TROMETHAMINE 15 MG/ML IJ SOLN
15.0000 mg | INTRAMUSCULAR | Status: DC
Start: 2020-10-01 — End: 2020-10-01

## 2020-10-01 MED ORDER — ORAL CARE MOUTH RINSE: 15.0000 mL | Freq: Once | OROMUCOSAL | Status: AC

## 2020-10-01 MED ORDER — SODIUM CHLORIDE 0.9 % IV SOLN: 250.0000 mL | INTRAVENOUS | Status: DC | PRN

## 2020-10-01 MED ORDER — LACTATED RINGERS IV SOLN: INTRAVENOUS | Status: DC

## 2020-10-01 MED ORDER — MORPHINE SULFATE (PF) 4 MG/ML IV SOLN
INTRAVENOUS | Status: AC
  Filled 2020-10-01: qty 1

## 2020-10-01 MED ORDER — SCOPOLAMINE 1 MG/3DAYS TD PT72
1.0000 | MEDICATED_PATCH | TRANSDERMAL | Status: DC
Start: 2020-10-01 — End: 2020-10-01
  Administered 2020-10-01: 1.5 mg via TRANSDERMAL
  Filled 2020-10-01: qty 1

## 2020-10-01 MED ORDER — PHENYLEPHRINE 40 MCG/ML (10ML) SYRINGE FOR IV PUSH (FOR BLOOD PRESSURE SUPPORT)
PREFILLED_SYRINGE | INTRAVENOUS | Status: AC
  Filled 2020-10-01: qty 10

## 2020-10-01 MED ORDER — PROMETHAZINE HCL 25 MG/ML IJ SOLN: 6.2500 mg | INTRAMUSCULAR | Status: DC | PRN

## 2020-10-01 MED ORDER — OXYCODONE HCL 5 MG PO TABS
ORAL_TABLET | ORAL | Status: AC
  Filled 2020-10-01: qty 2

## 2020-10-01 MED ORDER — EPHEDRINE SULFATE-NACL 50-0.9 MG/10ML-% IV SOSY
PREFILLED_SYRINGE | INTRAVENOUS | Status: DC | PRN
  Administered 2020-10-01: 10 mg via INTRAVENOUS
  Administered 2020-10-01: 5 mg via INTRAVENOUS
  Administered 2020-10-01: 15 mg via INTRAVENOUS
  Administered 2020-10-01: 5 mg via INTRAVENOUS

## 2020-10-01 MED ORDER — ONDANSETRON HCL 4 MG/2ML IJ SOLN
INTRAMUSCULAR | Status: DC | PRN
  Administered 2020-10-01: 4 mg via INTRAVENOUS

## 2020-10-01 MED ORDER — OXYCODONE HCL 5 MG PO TABS: 5.0000 mg | ORAL_TABLET | ORAL | Status: DC | PRN

## 2020-10-01 MED ORDER — LACTATED RINGERS IV SOLN: INTRAVENOUS | Status: DC | PRN

## 2020-10-01 MED ORDER — MORPHINE SULFATE (PF) 4 MG/ML IV SOLN
2.0000 mg | INTRAVENOUS | Status: DC | PRN
  Administered 2020-10-01: 2 mg via INTRAVENOUS

## 2020-10-01 MED ORDER — ROCURONIUM BROMIDE 10 MG/ML (PF) SYRINGE
PREFILLED_SYRINGE | INTRAVENOUS | Status: DC | PRN
  Administered 2020-10-01: 70 mg via INTRAVENOUS
  Administered 2020-10-01: 30 mg via INTRAVENOUS

## 2020-10-01 MED ORDER — HEPARIN SODIUM (PORCINE) 5000 UNIT/ML IJ SOLN
5000.0000 [IU] | INTRAMUSCULAR | Status: AC
  Administered 2020-10-01: 5000 [IU] via SUBCUTANEOUS
  Filled 2020-10-01: qty 1

## 2020-10-01 MED ORDER — POVIDONE-IODINE 10 % EX SWAB
2.0000 "application " | Freq: Once | CUTANEOUS | Status: AC
  Administered 2020-10-01: 2 via TOPICAL

## 2020-10-01 MED ORDER — BUPIVACAINE HCL 0.25 % IJ SOLN
INTRAMUSCULAR | Status: DC | PRN
  Administered 2020-10-01: 50 mL

## 2020-10-01 MED ORDER — CHLORHEXIDINE GLUCONATE 0.12 % MT SOLN
15.0000 mL | Freq: Once | OROMUCOSAL | Status: AC
  Administered 2020-10-01: 15 mL via OROMUCOSAL

## 2020-10-01 MED ORDER — MIDAZOLAM HCL 2 MG/2ML IJ SOLN
INTRAMUSCULAR | Status: AC
  Filled 2020-10-01: qty 2

## 2020-10-01 MED ORDER — SODIUM CHLORIDE 0.9% FLUSH: 3.0000 mL | Freq: Two times a day (BID) | INTRAVENOUS | Status: DC

## 2020-10-01 MED ORDER — ACETAMINOPHEN 650 MG RE SUPP: 650.0000 mg | RECTAL | Status: DC | PRN

## 2020-10-01 MED ORDER — DEXAMETHASONE SODIUM PHOSPHATE 4 MG/ML IJ SOLN: 4.0000 mg | INTRAMUSCULAR | Status: DC

## 2020-10-01 MED ORDER — STERILE WATER FOR IRRIGATION IR SOLN
Status: DC | PRN
  Administered 2020-10-01: 1000 mL

## 2020-10-01 MED ORDER — PHENYLEPHRINE 40 MCG/ML (10ML) SYRINGE FOR IV PUSH (FOR BLOOD PRESSURE SUPPORT)
PREFILLED_SYRINGE | INTRAVENOUS | Status: DC | PRN
  Administered 2020-10-01: 200 ug via INTRAVENOUS
  Administered 2020-10-01: 120 ug via INTRAVENOUS

## 2020-10-01 MED ORDER — ACETAMINOPHEN 500 MG PO TABS
1000.0000 mg | ORAL_TABLET | ORAL | Status: AC
  Administered 2020-10-01: 1000 mg via ORAL
  Filled 2020-10-01: qty 2

## 2020-10-01 MED ORDER — 0.9 % SODIUM CHLORIDE (POUR BTL) OPTIME
TOPICAL | Status: DC | PRN
  Administered 2020-10-01: 1000 mL

## 2020-10-01 MED ORDER — STERILE WATER FOR INJECTION IJ SOLN
INTRAMUSCULAR | Status: AC
  Filled 2020-10-01: qty 10

## 2020-10-01 MED ORDER — FENTANYL CITRATE (PF) 250 MCG/5ML IJ SOLN
INTRAMUSCULAR | Status: AC
  Filled 2020-10-01: qty 5

## 2020-10-01 MED ORDER — GABAPENTIN 300 MG PO CAPS
300.0000 mg | ORAL_CAPSULE | ORAL | Status: AC
  Administered 2020-10-01: 300 mg via ORAL
  Filled 2020-10-01: qty 1

## 2020-10-01 MED ORDER — MIDAZOLAM HCL 5 MG/5ML IJ SOLN
INTRAMUSCULAR | Status: DC | PRN
  Administered 2020-10-01: 2 mg via INTRAVENOUS

## 2020-10-01 MED ORDER — BUPIVACAINE HCL 0.25 % IJ SOLN
INTRAMUSCULAR | Status: AC
  Filled 2020-10-01: qty 1

## 2020-10-01 MED ORDER — ONDANSETRON HCL 4 MG/2ML IJ SOLN
INTRAMUSCULAR | Status: AC
  Filled 2020-10-01: qty 2

## 2020-10-01 MED ORDER — FENTANYL CITRATE (PF) 100 MCG/2ML IJ SOLN
INTRAMUSCULAR | Status: AC
  Filled 2020-10-01: qty 2

## 2020-10-01 MED ORDER — PROPOFOL 10 MG/ML IV BOLUS
INTRAVENOUS | Status: DC | PRN
  Administered 2020-10-01: 150 mg via INTRAVENOUS

## 2020-10-01 SURGICAL SUPPLY — 75 items
APPLICATOR SURGIFLO ENDO (HEMOSTASIS) IMPLANT
BACTOSHIELD CHG 4% 4OZ (MISCELLANEOUS) ×2
BAG COUNTER SPONGE SURGICOUNT (BAG) IMPLANT
BAG LAPAROSCOPIC 12 15 PORT 16 (BASKET) ×2 IMPLANT
BAG RETRIEVAL 12/15 (BASKET) ×4
BAG RETRIEVAL 12/15MM (BASKET) ×2
BAG SURGICOUNT SPONGE COUNTING (BAG)
BLADE SURG SZ10 CARB STEEL (BLADE) IMPLANT
CNTNR URN SCR LID CUP LEK RST (MISCELLANEOUS) ×1 IMPLANT
CONT SPEC 4OZ STRL OR WHT (MISCELLANEOUS) ×2
COVER BACK TABLE 60X90IN (DRAPES) ×3 IMPLANT
COVER TIP SHEARS 8 DVNC (MISCELLANEOUS) ×1 IMPLANT
COVER TIP SHEARS 8MM DA VINCI (MISCELLANEOUS) ×2
DECANTER SPIKE VIAL GLASS SM (MISCELLANEOUS) IMPLANT
DERMABOND ADVANCED (GAUZE/BANDAGES/DRESSINGS) ×2
DERMABOND ADVANCED .7 DNX12 (GAUZE/BANDAGES/DRESSINGS) ×1 IMPLANT
DRAPE ARM DVNC X/XI (DISPOSABLE) ×4 IMPLANT
DRAPE COLUMN DVNC XI (DISPOSABLE) ×1 IMPLANT
DRAPE DA VINCI XI ARM (DISPOSABLE) ×8
DRAPE DA VINCI XI COLUMN (DISPOSABLE) ×2
DRAPE SHEET LG 3/4 BI-LAMINATE (DRAPES) ×3 IMPLANT
DRAPE SURG IRRIG POUCH 19X23 (DRAPES) ×3 IMPLANT
DRSG OPSITE POSTOP 4X6 (GAUZE/BANDAGES/DRESSINGS) ×3 IMPLANT
DRSG OPSITE POSTOP 4X8 (GAUZE/BANDAGES/DRESSINGS) IMPLANT
ELECT PENCIL ROCKER SW 15FT (MISCELLANEOUS) ×3 IMPLANT
ELECT REM PT RETURN 15FT ADLT (MISCELLANEOUS) ×3 IMPLANT
GAUZE 4X4 16PLY ~~LOC~~+RFID DBL (SPONGE) ×6 IMPLANT
GLOVE SURG ENC MOIS LTX SZ6 (GLOVE) ×15 IMPLANT
GLOVE SURG ENC MOIS LTX SZ6.5 (GLOVE) ×6 IMPLANT
GLOVE SURG UNDER POLY LF SZ6 (GLOVE) ×6 IMPLANT
GLOVE SURG UNDER POLY LF SZ7 (GLOVE) ×9 IMPLANT
GOWN STRL REUS W/ TWL LRG LVL3 (GOWN DISPOSABLE) ×5 IMPLANT
GOWN STRL REUS W/TWL LRG LVL3 (GOWN DISPOSABLE) ×19 IMPLANT
HOLDER FOLEY CATH W/STRAP (MISCELLANEOUS) ×3 IMPLANT
IRRIG SUCT STRYKERFLOW 2 WTIP (MISCELLANEOUS) ×3
IRRIGATION SUCT STRKRFLW 2 WTP (MISCELLANEOUS) ×1 IMPLANT
KIT PROCEDURE DA VINCI SI (MISCELLANEOUS)
KIT PROCEDURE DVNC SI (MISCELLANEOUS) IMPLANT
KIT TURNOVER KIT A (KITS) ×3 IMPLANT
MANIPULATOR UTERINE 4.5 ZUMI (MISCELLANEOUS) ×3 IMPLANT
NEEDLE HYPO 21X1.5 SAFETY (NEEDLE) ×3 IMPLANT
NEEDLE SPNL 18GX3.5 QUINCKE PK (NEEDLE) IMPLANT
OBTURATOR OPTICAL STANDARD 8MM (TROCAR) ×2
OBTURATOR OPTICAL STND 8 DVNC (TROCAR) ×1
OBTURATOR OPTICALSTD 8 DVNC (TROCAR) ×1 IMPLANT
PACK ROBOT GYN CUSTOM WL (TRAY / TRAY PROCEDURE) ×3 IMPLANT
PAD POSITIONING PINK XL (MISCELLANEOUS) ×9 IMPLANT
PORT ACCESS TROCAR AIRSEAL 12 (TROCAR) ×1 IMPLANT
PORT ACCESS TROCAR AIRSEAL 5M (TROCAR) ×2
PORT LAP GEL ALEXIS MED 5-9CM (MISCELLANEOUS) ×3 IMPLANT
POUCH SPECIMEN RETRIEVAL 10MM (ENDOMECHANICALS) IMPLANT
SCRUB CHG 4% DYNA-HEX 4OZ (MISCELLANEOUS) ×1 IMPLANT
SEAL CANN UNIV 5-8 DVNC XI (MISCELLANEOUS) ×4 IMPLANT
SEAL XI 5MM-8MM UNIVERSAL (MISCELLANEOUS) ×8
SET TRI-LUMEN FLTR TB AIRSEAL (TUBING) ×3 IMPLANT
SOL ANTI FOG 6CC (MISCELLANEOUS) ×1 IMPLANT
SOLUTION ANTI FOG 6CC (MISCELLANEOUS) ×2
SPONGE T-LAP 18X18 ~~LOC~~+RFID (SPONGE) ×3 IMPLANT
SURGIFLO W/THROMBIN 8M KIT (HEMOSTASIS) IMPLANT
SUT MNCRL AB 4-0 PS2 18 (SUTURE) IMPLANT
SUT PDS AB 1 TP1 96 (SUTURE) ×6 IMPLANT
SUT VIC AB 0 CT1 27 (SUTURE)
SUT VIC AB 0 CT1 27XBRD ANTBC (SUTURE) IMPLANT
SUT VIC AB 2-0 CT1 27 (SUTURE)
SUT VIC AB 2-0 CT1 TAPERPNT 27 (SUTURE) IMPLANT
SUT VIC AB 4-0 PS2 18 (SUTURE) ×6 IMPLANT
SYR 10ML LL (SYRINGE) IMPLANT
SYR BULB IRRIG 60ML STRL (SYRINGE) ×3 IMPLANT
TOWEL OR NON WOVEN STRL DISP B (DISPOSABLE) ×3 IMPLANT
TRAP SPECIMEN MUCUS 40CC (MISCELLANEOUS) IMPLANT
TRAY FOLEY MTR SLVR 16FR STAT (SET/KITS/TRAYS/PACK) ×3 IMPLANT
TROCAR XCEL NON-BLD 5MMX100MML (ENDOMECHANICALS) IMPLANT
UNDERPAD 30X36 HEAVY ABSORB (UNDERPADS AND DIAPERS) ×3 IMPLANT
WATER STERILE IRR 1000ML POUR (IV SOLUTION) ×3 IMPLANT
YANKAUER SUCT BULB TIP 10FT TU (MISCELLANEOUS) ×3 IMPLANT

## 2020-10-01 NOTE — Discharge Instructions (Signed)
Return to work: 4 weeks (2 weeks with physical restrictions).  Activity: 1. Be up and out of the bed during the day.  Take a nap if needed.  You may walk up steps but be careful and use the hand rail.  Stair climbing will tire you more than you think, you may need to stop part way and rest.   2. No lifting or straining for 4 weeks.  3. No driving for 1 weeks.  Do Not drive if you are taking narcotic pain medicine.  4. Shower daily.  Use soap and water on your incision and pat dry; don't rub.   5. No sexual activity and nothing in the vagina for 8 weeks.  Medications:  - Take ibuprofen and tylenol first line for pain control. Take these regularly (every 6 hours) to decrease the build up of pain.  - If necessary, for severe pain not relieved by ibuprofen, contact Dr Charisse March office and you will be prescribed percocet.  - While taking percocet you should take sennakot every night to reduce the likelihood of constipation. If this causes diarrhea, stop its use.  Diet: 1. Low sodium Heart Healthy Diet is recommended.  2. It is safe to use a laxative if you have difficulty moving your bowels.   Wound Care: 1. Keep clean and dry.  Shower daily.  Reasons to call the Doctor:  Fever - Oral temperature greater than 100.4 degrees Fahrenheit Foul-smelling vaginal discharge Difficulty urinating Nausea and vomiting Increased pain at the site of the incision that is unrelieved with pain medicine. Difficulty breathing with or without chest pain New calf pain especially if only on one side Sudden, continuing increased vaginal bleeding with or without clots.   Follow-up: 1. See Jeral Pinch in 4 weeks.  Contacts: For questions or concerns you should contact:  Dr. Jeral Pinch at [phone] After hours and on week-ends call [phone] and ask to speak to the physician on call for Gynecologic Oncology

## 2020-10-01 NOTE — Anesthesia Postprocedure Evaluation (Signed)
Anesthesia Post Note  Patient: ADALIE MAND  Procedure(s) Performed: XI ROBOTIC ASSISTED  BILATERAL SALPINGO OOPHORECTOMY, MINI LAPAROTOMY FOR CYST DECOMPRESSION (Bilateral: Abdomen)     Patient location during evaluation: PACU Anesthesia Type: General Level of consciousness: awake and alert Pain management: pain level controlled Vital Signs Assessment: post-procedure vital signs reviewed and stable Respiratory status: spontaneous breathing, nonlabored ventilation, respiratory function stable and patient connected to nasal cannula oxygen Cardiovascular status: blood pressure returned to baseline, stable and bradycardic Postop Assessment: no apparent nausea or vomiting Anesthetic complications: no   No notable events documented.  Last Vitals:  Vitals:   10/01/20 1200 10/01/20 1215  BP:  113/73  Pulse: (!) 50 (!) 55  Resp: 14 16  Temp:  36.4 C  SpO2: 100% 97%    Last Pain:  Vitals:   10/01/20 1215  TempSrc:   PainSc: 0-No pain                 Catalina Gravel

## 2020-10-01 NOTE — Interval H&P Note (Signed)
History and Physical Interval Note:  10/01/2020 7:08 AM  Catherine Johnson  has presented today for surgery, with the diagnosis of ADNEXAL MASS.  The various methods of treatment have been discussed with the patient and family. After consideration of risks, benefits and other options for treatment, the patient has consented to  Procedure(s): XI ROBOTIC ASSISTED  BILATERAL SALPINGO OOPHORECTOMY POSSIBLE HYSTERECTOMY,POSSIBLE STAGING, POSSIBLE LAPAROTOMY, MINI LAPAROTOMY FOR CYST DECOMPRESSION (Bilateral) as a surgical intervention.  The patient's history has been reviewed, patient examined, no change in status, stable for surgery.  I have reviewed the patient's chart and labs.  Questions were answered to the patient's satisfaction.     Lafonda Mosses

## 2020-10-01 NOTE — Anesthesia Procedure Notes (Signed)
Procedure Name: Intubation Date/Time: 10/01/2020 8:04 AM Performed by: Gean Maidens, CRNA Pre-anesthesia Checklist: Patient identified, Emergency Drugs available, Suction available, Patient being monitored and Timeout performed Patient Re-evaluated:Patient Re-evaluated prior to induction Oxygen Delivery Method: Circle system utilized Preoxygenation: Pre-oxygenation with 100% oxygen Induction Type: IV induction Ventilation: Mask ventilation without difficulty Laryngoscope Size: Mac and 4 Grade View: Grade I Tube size: 7.0 mm Number of attempts: 1 Airway Equipment and Method: Stylet Placement Confirmation: ETT inserted through vocal cords under direct vision, positive ETCO2 and breath sounds checked- equal and bilateral Secured at: 21 cm Tube secured with: Tape Dental Injury: Teeth and Oropharynx as per pre-operative assessment

## 2020-10-01 NOTE — Brief Op Note (Signed)
10/01/2020  10:08 AM  PATIENT:  Catherine Johnson  65 y.o. female  PRE-OPERATIVE DIAGNOSIS:  ADNEXAL MASS  POST-OPERATIVE DIAGNOSIS:  ADNEXAL MASS  PROCEDURE:  Procedure(s): XI ROBOTIC ASSISTED  BILATERAL SALPINGO OOPHORECTOMY, MINI LAPAROTOMY FOR CYST DECOMPRESSION (Bilateral)  SURGEON:  Surgeon(s) and Role:    Lafonda Mosses, MD - Primary    * Lahoma Crocker, MD - Assisting  ANESTHESIA:   general  EBL:  50 mL   BLOOD ADMINISTERED:none  DRAINS: none   LOCAL MEDICATIONS USED:  MARCAINE     SPECIMEN:  bilateral tubes and ovaries, pelvic washings  DISPOSITION OF SPECIMEN:  PATHOLOGY  COUNTS:  YES  TOURNIQUET:  * No tourniquets in log *  DICTATION: .Note written in EPIC  PLAN OF CARE: Discharge to home after PACU  PATIENT DISPOSITION:  PACU - hemodynamically stable.   Delay start of Pharmacological VTE agent (>24hrs) due to surgical blood loss or risk of bleeding: not applicable

## 2020-10-01 NOTE — Transfer of Care (Signed)
Immediate Anesthesia Transfer of Care Note  Patient: Catherine Johnson  Procedure(s) Performed: XI ROBOTIC ASSISTED  BILATERAL SALPINGO OOPHORECTOMY, MINI LAPAROTOMY FOR CYST DECOMPRESSION (Bilateral: Abdomen)  Patient Location: PACU  Anesthesia Type:General  Level of Consciousness: sedated, patient cooperative and responds to stimulation  Airway & Oxygen Therapy: Patient Spontanous Breathing and Patient connected to face mask oxygen  Post-op Assessment: Report given to RN and Post -op Vital signs reviewed and stable  Post vital signs: Reviewed and stable  Last Vitals:  Vitals Value Taken Time  BP 126/71 10/01/20 1010  Temp    Pulse 59 10/01/20 1012  Resp 20 10/01/20 1012  SpO2 100 % 10/01/20 1012  Vitals shown include unvalidated device data.  Last Pain:  Vitals:   10/01/20 0620  TempSrc:   PainSc: 0-No pain         Complications: No notable events documented.

## 2020-10-01 NOTE — Op Note (Signed)
OPERATIVE NOTE  Pre-operative Diagnosis: Large complex abdominopelvic mass  Post-operative Diagnosis: same, cystic adenofibroma on frozen  Operation: Robotic-assisted laparoscopic bilateral salpingoophorectomy, mini-lap for cyst decompression and delivery  Surgeon: Jeral Pinch MD  Assistant Surgeon: Lahoma Crocker MD (an MD assistant was necessary for tissue manipulation, management of robotic instrumentation, retraction and positioning due to the complexity of the case and hospital policies).   Anesthesia: GET  Urine Output: 150  Operative Findings: On EUA, small mobile uterus. Large mass filling pelvis and lower abdomen. ON intra-abdominal entry, normal diaphragm, liver, stomach, and omentum. Normal appearing small and large bowel. 18cm multicystic smooth mass arising from and replacing the right ovary. Right fallopian tube with surgically absent segment. Normal and atrophic left adnexa. Uterus 8cm and normal appearing. NO ascites. NO intra-abdominal or pelvic evidence of disease.  Estimated Blood Loss:  less than 100 mL      Total IV Fluids: see I&O flowsheet         Specimens: bilateral tubes and ovaries, pelvic washings         Complications:  None apaprent; patient tolerated the procedure well.         Disposition: PACU - hemodynamically stable.  Procedure Details  The patient was seen in the Holding Room. The risks, benefits, complications, treatment options, and expected outcomes were discussed with the patient.  The patient concurred with the proposed plan, giving informed consent.  The site of surgery properly noted/marked. The patient was identified as Catherine Johnson and the procedure verified as a Robotic-assisted bilateral salpingo oophorectomy.   After induction of anesthesia, the patient was draped and prepped in the usual sterile manner. Patient was placed in supine position after anesthesia and draped and prepped in the usual sterile manner as follows:  Her arms were tucked to her side with all appropriate precautions.  The shoulders were stabilized with padded shoulder blocks applied to the acromium processes.  The patient was placed in the semi-lithotomy position in [place].  The perineum and vagina were prepped with CholoraPrep. The patient was draped after the CholoraPrep had been allowed to dry for 3 minutes.  A Time Out was held and the above information confirmed.  The urethra was prepped with Betadine. Foley catheter was placed.  A sterile speculum was placed in the vagina.  The cervix was grasped with a single-tooth tenaculum. The cervix was dilated with Kennon Rounds dilators.  The ZUMI uterine manipulator with a small colpotomizer ring was placed without difficulty.  OG tube placement was confirmed and to suction.   Next, a 10 mm skin incision was made 1 cm below the subcostal margin in the midclavicular line.  The 5 mm Optiview port and scope was used for direct entry.  Opening pressure was under 10 mm CO2.  The abdomen was insufflated and the findings were noted as above.   At this point and all points during the procedure, the patient's intra-abdominal pressure did not exceed 15 mmHg.   Next, an 8 cm incision was made lateral to the umbilicus and carried down to the fascia with electrocautery. With the abdomen still insufflated, the rectus muscles were separated and the peritoneum was entered sharply. The incision was then opened. Pelvic washings were collected and the abdomen was explored manually. A 2-0 vicryl suture was then used to make a pursestring stitch in one of the cystic components and the gallbladder trocar was inserted and used to drain several cystic components in a contained manner. The suture was then tied  down and the [place] and laparoscopic cap system was placed. The abdomen was then re-insufflated.   Next, an 8 mm skin incision were made and a right and left port were placed about 8 cm lateral to the midline on the right and  left side.  A fourth arm was placed on the right.  The 5 mm assist trocar was exchanged for a 10-12 mm port. A robotic port was placed through the cap system in the midline. All ports were placed under direct visualization.  The patient was placed in steep Trendelenburg.  Bowel was folded away into the upper abdomen.  The robot was docked in the normal manner.  The right and left peritoneum were opened parallel to the IP ligament to open the retroperitoneal spaces bilaterally. The round ligaments were left intact. The ureter was noted to be on the medial leaf of the broad ligament.  The peritoneum above the ureter was incised and stretched and the infundibulopelvic ligament was skeletonized, cauterized and cut.  ON the right the peri-appendix mesentery was adherent to the IP ligament and lysis of adhesions was performed to free the appendix. The utero-ovarian ligament and fallopian tube were then skeletonized near the uterine fundus, cauterized, and transected. This was performed bilaterally and the adnexa were freed. Bilateral adnexa were placed in an Endocatch bags. The right adnexa was removed first after the robot was undocked, the laparoscopic cap was removed and the bag was brought through the mini-lap incision. The right adnexa was then handed off the field to be sent to pathology. The same was performed with the left. The cap was then replaced, the abdomen insufflated and robot docket. Instruments were placed into the abdomen until direct visualization.   Irrigation was used and excellent hemostasis was achieved.  After frozen section returned, the procedure was completed.  Robotic instruments were removed under direct visulaization.  The robot was undocked.   The fascia at the mini-lap incision was closed with 0 looped PDS tied at the midline. The subcutaneous tissue was irrigated and hemostasis assured. The subcutaneous tissue was reapproximated with 2 layers to 2-0 Vicryl.  The fascia at the 10-12  mm port was closed with 0 Vicryl on a UR-5 needle.  The subcuticular tissue of all incisions was closed with 4-0 Vicryl and the skin was closed with 4-0 Monocryl in a subcuticular manner.  Dermabond was applied.  A honeycomb dressing was applied to the mini-lap site.  The vagina was swabbed with  minimal bleeding noted.   All sponge, lap and needle counts were correct x  3. Foley catheter was removed.  The patient was transferred to the recovery room in stable condition.  Jeral Pinch, MD

## 2020-10-02 ENCOUNTER — Telehealth: Payer: Self-pay

## 2020-10-02 ENCOUNTER — Encounter (HOSPITAL_COMMUNITY): Payer: Self-pay | Admitting: Gynecologic Oncology

## 2020-10-02 NOTE — Telephone Encounter (Signed)
Spoke with Catherine Johnson this morning. She states she is eating, drinking and urinating well. She has not had a BM. No passing gas. No n/v. She is taking senokot as prescribed and will increase to 2 tabs bid. If no BM by Sunday 10-04-20 She will add a capful of Miralax bid.Encouraged her to drink plenty of water. She denies fever or chills. Incisions are dry and intact. She will remove the Honeycomb dressing on Tuesday 10-06-20 and  keep incision open to the air. Her pain is controlled with Ibuprofen alt. with tylenol and tramadol intermittently.    Instructed to call office with any fever, chills, purulent drainage, uncontrolled pain or any other questions or concerns. Patient verbalizes understanding.  Pt aware of post op appointments as well as the office number [phone] and after hours number [phone] to call if she has any questions or concerns

## 2020-10-06 LAB — CYTOLOGY - NON PAP

## 2020-10-06 LAB — SURGICAL PATHOLOGY

## 2020-10-23 ENCOUNTER — Encounter: Payer: Self-pay | Admitting: Gynecologic Oncology

## 2020-10-23 ENCOUNTER — Other Ambulatory Visit: Payer: Self-pay

## 2020-10-23 ENCOUNTER — Inpatient Hospital Stay: Payer: BC Managed Care – PPO | Attending: Gynecologic Oncology | Admitting: Gynecologic Oncology

## 2020-10-23 VITALS — BP 126/82 | HR 61 | Temp 97.0°F | Resp 18 | Wt 293.2 lb

## 2020-10-23 DIAGNOSIS — D270 Benign neoplasm of right ovary: Secondary | ICD-10-CM

## 2020-10-23 DIAGNOSIS — Z90722 Acquired absence of ovaries, bilateral: Secondary | ICD-10-CM

## 2020-10-23 NOTE — Patient Instructions (Signed)
You are healing great from surgery!  Remember no lifting more than 10 pounds until 6 weeks after surgery.  Otherwise, you can resume normal activities.  I am releasing you back to your GYN for women's health care needs.  Please not hesitate to reach out if you need anything in the future.

## 2020-10-23 NOTE — Progress Notes (Signed)
Gynecologic Oncology Return Clinic Visit  10/23/20  Reason for Visit: post-operative follow-up   Treatment History: 10/01/20: Robotic BSO, mini-lap for cyst decompression and delivery. Final pathology revealed benign cyst.  Interval History: Patient presents today for follow-up after surgery.  She notes overall doing well.  She was struggling significantly with constipation before surgery, this has improved since surgery.  She also reports urinating less frequently since surgery.  She has some discomfort related to her mini lap incision, otherwise denies abdominal pain.  Tolerating a regular diet without nausea or emesis.  Denies any fevers or chills.  Past Medical/Surgical History: Past Medical History:  Diagnosis Date   Anxiety    Depression    GERD (gastroesophageal reflux disease)    Gout    History of PCR DNA positive for HSV2    Hyperlipidemia    Hypertension    Obesity, Class III, BMI 40-49.9 (morbid obesity) ([place])     Past Surgical History:  Procedure Laterality Date   BREAST BIOPSY     CESAREAN SECTION     2 previous   COLONOSCOPY  07/2019   ROBOTIC ASSISTED SALPINGO OOPHERECTOMY Bilateral 10/01/2020   Procedure: XI ROBOTIC ASSISTED  BILATERAL SALPINGO OOPHORECTOMY, MINI LAPAROTOMY FOR CYST DECOMPRESSION;  Surgeon: Lafonda Mosses, MD;  Location: [facility];  Service: Gynecology;  Laterality: Bilateral;    Family History  Problem Relation Age of Onset   Diabetes Mother    Hypertension Mother    Hypertension Sister    Colon cancer Neg Hx    Breast cancer Neg Hx    Ovarian cancer Neg Hx    Endometrial cancer Neg Hx    Pancreatic cancer Neg Hx    Prostate cancer Neg Hx     Social History   Socioeconomic History   Marital status: Single    Spouse name: Not on file   Number of children: Not on file   Years of education: Not on file   Highest education level: Some college, no degree  Occupational History   Occupation: retired  Tobacco Use   Smoking status:  Never   Smokeless tobacco: Never  Vaping Use   Vaping Use: Never used  Substance and Sexual Activity   Alcohol use: No   Drug use: No   Sexual activity: Not Currently    Birth control/protection: None  Other Topics Concern   Not on file  Social History Narrative   Not on file   Social Determinants of Health   Financial Resource Strain: Not on file  Food Insecurity: Not on file  Transportation Needs: Not on file  Physical Activity: Not on file  Stress: Not on file  Social Connections: Not on file    Current Medications:  Current Outpatient Medications:    allopurinol (ZYLOPRIM) 300 MG tablet, Take 300 mg by mouth daily., Disp: , Rfl:    atorvastatin (LIPITOR) 10 MG tablet, Take 10 mg by mouth daily., Disp: , Rfl:    benazepril (LOTENSIN) 40 MG tablet, Take 40 mg by mouth daily., Disp: , Rfl:    diclofenac Sodium (VOLTAREN) 1 % GEL, Apply 2 g topically 2 (two) times daily as needed (knee and shoulder pain)., Disp: , Rfl:    hydrochlorothiazide (HYDRODIURIL) 25 MG tablet, Take 25 mg by mouth daily., Disp: , Rfl:    ibuprofen (ADVIL) 600 MG tablet, Take 1 tablet (600 mg total) by mouth every 8 (eight) hours as needed for moderate pain. For AFTER surgery only, Disp: 30 tablet, Rfl: 0  metoprolol succinate (TOPROL-XL) 50 MG 24 hr tablet, Take 50 mg by mouth daily. Take with or immediately following a meal., Disp: , Rfl:    senna-docusate (SENOKOT-S) 8.6-50 MG tablet, Take 2 tablets by mouth at bedtime. For AFTER surgery, do not take if having diarrhea, Disp: 30 tablet, Rfl: 0   sertraline (ZOLOFT) 25 MG tablet, Take 25 mg by mouth daily., Disp: , Rfl:    traMADol (ULTRAM) 50 MG tablet, Take 1 tablet (50 mg total) by mouth every 6 (six) hours as needed for severe pain. For AFTER surgery only, do not take and drive, Disp: 10 tablet, Rfl: 0   Vitamin D, Ergocalciferol, (DRISDOL) 1.25 MG (50000 UNIT) CAPS capsule, Take 50,000 Units by mouth every 7 (seven) days., Disp: , Rfl:   Review  of Systems: Denies appetite changes, fevers, chills, fatigue, unexplained weight changes. Denies hearing loss, neck lumps or masses, mouth sores, ringing in ears or voice changes. Denies cough or wheezing.  Denies shortness of breath. Denies chest pain or palpitations. Denies leg swelling. Denies abdominal distention, pain, blood in stools, constipation, diarrhea, nausea, vomiting, or early satiety. Denies pain with intercourse, dysuria, frequency, hematuria or incontinence. Denies hot flashes, pelvic pain, vaginal bleeding or vaginal discharge.   Denies joint pain, back pain or muscle pain/cramps. Denies itching, rash, or wounds. Denies dizziness, headaches, numbness or seizures. Denies swollen lymph nodes or glands, denies easy bruising or bleeding. Denies anxiety, depression, confusion, or decreased concentration.  Physical Exam: BP 126/82 (BP Location: Right Arm, Patient Position: Sitting)   Pulse 61   Temp (!) 97 F (36.1 C) (Tympanic)   Resp 18   Wt 293 lb 3.2 oz (133 kg)   SpO2 100%   BMI 39.77 kg/m  General: Alert, oriented, no acute distress. HEENT: , sclera anicteric. Chest: Unlabored breathing on room air. Abdomen: Obese, soft, nontender.  Normoactive bowel sounds.  No masses or hepatosplenomegaly appreciated.  Well-healing laparoscopic and mini lap incision.  Mini laparotomy incision with some Dermabond, this was removed today. Extremities: Grossly normal range of motion.  Warm, well perfused.  No edema bilaterally.  Laboratory & Radiologic Studies: Pathology 7/21: A. OVARY AND TUBE, RIGHT, SALPINGO OOPHORECTOMY:  - Benign lymphangioma with hemorrhage, 12 cm.  See comment  - No evidence of malignancy   B. OVARY AND TUBE, LEFT, SALPINGO OOPHORECTOMY:  - Benign unremarkable ovary and segment of fallopian tube  - No evidence of malignancy   Assessment & Plan: GENEVIA VOLPE is a 65 y.o. woman s/p Robotic BSO for large abdominopelvic mass with benign final  pathology.  Patient is meeting post operative milestones.  We discussed continued lifting restrictions as well as postoperative expectations.  Patient was given a copy of her pathology report which we reviewed in detail again today.  I am releasing her back to her GYN for continued women's health care needs.  26 minutes of total time was spent for this patient encounter, including preparation, face-to-face counseling with the patient and coordination of care, and documentation of the encounter.  Jeral Pinch, MD  Division of Gynecologic Oncology  Department of Obstetrics and Gynecology  [facility]

## 2020-12-14 ENCOUNTER — Other Ambulatory Visit: Payer: Self-pay | Admitting: Nurse Practitioner

## 2020-12-14 DIAGNOSIS — Z1231 Encounter for screening mammogram for malignant neoplasm of breast: Secondary | ICD-10-CM

## 2021-01-14 DIAGNOSIS — E785 Hyperlipidemia, unspecified: Secondary | ICD-10-CM | POA: Diagnosis not present

## 2021-01-14 DIAGNOSIS — E559 Vitamin D deficiency, unspecified: Secondary | ICD-10-CM | POA: Diagnosis not present

## 2021-01-14 DIAGNOSIS — N1831 Chronic kidney disease, stage 3a: Secondary | ICD-10-CM | POA: Diagnosis not present

## 2021-01-14 DIAGNOSIS — I10 Essential (primary) hypertension: Secondary | ICD-10-CM | POA: Diagnosis not present

## 2021-01-19 DIAGNOSIS — R3914 Feeling of incomplete bladder emptying: Secondary | ICD-10-CM | POA: Diagnosis not present

## 2021-01-19 DIAGNOSIS — N898 Other specified noninflammatory disorders of vagina: Secondary | ICD-10-CM | POA: Diagnosis not present

## 2021-01-27 ENCOUNTER — Ambulatory Visit
Admission: RE | Admit: 2021-01-27 | Discharge: 2021-01-27 | Disposition: A | Discharge status: Home / Self Care | Payer: BC Managed Care – PPO | Source: Ambulatory Visit | Attending: Nurse Practitioner | Admitting: Nurse Practitioner

## 2021-01-27 DIAGNOSIS — Z1231 Encounter for screening mammogram for malignant neoplasm of breast: Secondary | ICD-10-CM

## 2021-03-10 ENCOUNTER — Ambulatory Visit
Admission: RE | Admit: 2021-03-10 | Discharge: 2021-03-10 | Disposition: A | Discharge status: Home / Self Care | Payer: BC Managed Care – PPO | Source: Ambulatory Visit | Attending: Nurse Practitioner | Admitting: Nurse Practitioner

## 2021-03-10 ENCOUNTER — Other Ambulatory Visit: Payer: Self-pay

## 2021-03-10 DIAGNOSIS — E2839 Other primary ovarian failure: Secondary | ICD-10-CM

## 2021-03-10 DIAGNOSIS — Z780 Asymptomatic menopausal state: Secondary | ICD-10-CM | POA: Diagnosis not present

## 2021-08-10 DIAGNOSIS — M1A9XX0 Chronic gout, unspecified, without tophus (tophi): Secondary | ICD-10-CM | POA: Diagnosis not present

## 2021-08-10 DIAGNOSIS — I10 Essential (primary) hypertension: Secondary | ICD-10-CM | POA: Diagnosis not present

## 2021-08-10 DIAGNOSIS — Z0000 Encounter for general adult medical examination without abnormal findings: Secondary | ICD-10-CM | POA: Diagnosis not present

## 2021-08-10 DIAGNOSIS — N1831 Chronic kidney disease, stage 3a: Secondary | ICD-10-CM | POA: Diagnosis not present

## 2021-08-10 DIAGNOSIS — E785 Hyperlipidemia, unspecified: Secondary | ICD-10-CM | POA: Diagnosis not present

## 2021-08-10 DIAGNOSIS — E559 Vitamin D deficiency, unspecified: Secondary | ICD-10-CM | POA: Diagnosis not present

## 2021-11-04 DIAGNOSIS — Z01419 Encounter for gynecological examination (general) (routine) without abnormal findings: Secondary | ICD-10-CM | POA: Diagnosis not present

## 2021-11-04 DIAGNOSIS — N898 Other specified noninflammatory disorders of vagina: Secondary | ICD-10-CM | POA: Diagnosis not present

## 2021-12-07 DIAGNOSIS — N898 Other specified noninflammatory disorders of vagina: Secondary | ICD-10-CM | POA: Diagnosis not present

## 2021-12-07 DIAGNOSIS — R350 Frequency of micturition: Secondary | ICD-10-CM | POA: Diagnosis not present

## 2021-12-07 DIAGNOSIS — N3941 Urge incontinence: Secondary | ICD-10-CM | POA: Diagnosis not present

## 2021-12-07 DIAGNOSIS — R102 Pelvic and perineal pain: Secondary | ICD-10-CM | POA: Diagnosis not present

## 2021-12-08 DIAGNOSIS — R102 Pelvic and perineal pain: Secondary | ICD-10-CM | POA: Diagnosis not present

## 2021-12-09 DIAGNOSIS — R1030 Lower abdominal pain, unspecified: Secondary | ICD-10-CM | POA: Diagnosis not present

## 2021-12-09 DIAGNOSIS — Z6841 Body Mass Index (BMI) 40.0 and over, adult: Secondary | ICD-10-CM | POA: Diagnosis not present

## 2021-12-15 DIAGNOSIS — H524 Presbyopia: Secondary | ICD-10-CM | POA: Diagnosis not present

## 2021-12-15 DIAGNOSIS — H5203 Hypermetropia, bilateral: Secondary | ICD-10-CM | POA: Diagnosis not present

## 2021-12-15 DIAGNOSIS — H52223 Regular astigmatism, bilateral: Secondary | ICD-10-CM | POA: Diagnosis not present

## 2021-12-28 ENCOUNTER — Other Ambulatory Visit: Payer: Self-pay | Admitting: Family Medicine

## 2021-12-28 DIAGNOSIS — R1030 Lower abdominal pain, unspecified: Secondary | ICD-10-CM

## 2022-01-17 ENCOUNTER — Ambulatory Visit
Admission: RE | Admit: 2022-01-17 | Discharge: 2022-01-17 | Disposition: A | Discharge status: Home / Self Care | Payer: Self-pay | Source: Ambulatory Visit | Attending: Family Medicine | Admitting: Family Medicine

## 2022-01-17 DIAGNOSIS — I700 Atherosclerosis of aorta: Secondary | ICD-10-CM | POA: Diagnosis not present

## 2022-01-17 DIAGNOSIS — R1030 Lower abdominal pain, unspecified: Secondary | ICD-10-CM

## 2022-01-17 DIAGNOSIS — K5730 Diverticulosis of large intestine without perforation or abscess without bleeding: Secondary | ICD-10-CM | POA: Diagnosis not present

## 2022-01-17 DIAGNOSIS — K449 Diaphragmatic hernia without obstruction or gangrene: Secondary | ICD-10-CM | POA: Diagnosis not present

## 2022-01-17 MED ORDER — IOPAMIDOL (ISOVUE-300) INJECTION 61%
100.0000 mL | Freq: Once | INTRAVENOUS | Status: AC | PRN
  Administered 2022-01-17: 100 mL via INTRAVENOUS

## 2022-01-24 ENCOUNTER — Other Ambulatory Visit: Payer: Self-pay | Admitting: Nurse Practitioner

## 2022-01-24 DIAGNOSIS — Z1231 Encounter for screening mammogram for malignant neoplasm of breast: Secondary | ICD-10-CM

## 2022-02-17 ENCOUNTER — Ambulatory Visit
Admission: RE | Admit: 2022-02-17 | Discharge: 2022-02-17 | Disposition: A | Discharge status: Home / Self Care | Payer: Medicare Other | Source: Ambulatory Visit | Attending: Nurse Practitioner | Admitting: Nurse Practitioner

## 2022-02-17 DIAGNOSIS — Z1231 Encounter for screening mammogram for malignant neoplasm of breast: Secondary | ICD-10-CM

## 2022-02-18 DIAGNOSIS — R7309 Other abnormal glucose: Secondary | ICD-10-CM | POA: Diagnosis not present

## 2022-02-18 DIAGNOSIS — I10 Essential (primary) hypertension: Secondary | ICD-10-CM | POA: Diagnosis not present

## 2022-02-18 DIAGNOSIS — F3341 Major depressive disorder, recurrent, in partial remission: Secondary | ICD-10-CM | POA: Diagnosis not present

## 2022-02-18 DIAGNOSIS — N1831 Chronic kidney disease, stage 3a: Secondary | ICD-10-CM | POA: Diagnosis not present

## 2022-07-29 DIAGNOSIS — H524 Presbyopia: Secondary | ICD-10-CM | POA: Diagnosis not present

## 2022-08-23 ENCOUNTER — Telehealth: Payer: Self-pay

## 2022-08-23 DIAGNOSIS — I700 Atherosclerosis of aorta: Secondary | ICD-10-CM | POA: Diagnosis not present

## 2022-08-23 DIAGNOSIS — I10 Essential (primary) hypertension: Secondary | ICD-10-CM | POA: Diagnosis not present

## 2022-08-23 DIAGNOSIS — M1A9XX0 Chronic gout, unspecified, without tophus (tophi): Secondary | ICD-10-CM | POA: Diagnosis not present

## 2022-08-23 DIAGNOSIS — Z136 Encounter for screening for cardiovascular disorders: Secondary | ICD-10-CM | POA: Diagnosis not present

## 2022-08-23 DIAGNOSIS — N1831 Chronic kidney disease, stage 3a: Secondary | ICD-10-CM | POA: Diagnosis not present

## 2022-08-23 DIAGNOSIS — K219 Gastro-esophageal reflux disease without esophagitis: Secondary | ICD-10-CM | POA: Diagnosis not present

## 2022-08-23 DIAGNOSIS — F3341 Major depressive disorder, recurrent, in partial remission: Secondary | ICD-10-CM | POA: Diagnosis not present

## 2022-08-23 DIAGNOSIS — E559 Vitamin D deficiency, unspecified: Secondary | ICD-10-CM | POA: Diagnosis not present

## 2022-08-23 DIAGNOSIS — Z0000 Encounter for general adult medical examination without abnormal findings: Secondary | ICD-10-CM | POA: Diagnosis not present

## 2022-08-23 NOTE — Patient Outreach (Signed)
  Care Coordination   In Person Provider Office Visit Note   08/23/2022 Name: MAHNOOR MATHISEN MRN: [id number] DOB: 1955-10-23  SHABREE TEBBETTS is a 67 y.o. year old female who sees Orischak, Alphonsa Gin, FNP for primary care. I engaged with Lloyd Huger in the providers office today.  What matters to the patients health and wellness today?  none    Goals Addressed             This Visit's Progress    COMPLETED: Care Coordination Activities-No follow up required       Care Coordination Interventions: Discussed [facility] services and support. Assessed SDOH. Advised to discuss with primary care physician if services needed in the future.         SDOH assessments and interventions completed:  Yes  SDOH Interventions Today    Flowsheet Row Most Recent Value  SDOH Interventions   Housing Interventions Intervention Not Indicated  Transportation Interventions Intervention Not Indicated        Care Coordination Interventions:  Yes, provided   Follow up plan: No further intervention required.   Encounter Outcome:  Pt. Visit Completed   Bary Leriche, RN, MSN [facility] Care Management Care Management Coordinator Direct Line [phone]

## 2022-08-23 NOTE — Patient Instructions (Signed)
Visit Information  Thank you for taking time to visit with me today. Please don't hesitate to contact me if I can be of assistance to you.   Following are the goals we discussed today:   Goals Addressed             This Visit's Progress    COMPLETED: Care Coordination Activities-No follow up required       Care Coordination Interventions: Discussed [facility] services and support. Assessed SDOH. Advised to discuss with primary care physician if services needed in the future.          If you are experiencing a Mental Health or Behavioral Health Crisis or need someone to talk to, please call the [place] National Suicide Prevention Lifeline: (502)720-3768 or TTY: (520)015-6894 TTY (503) 439-0427) to talk to a trained counselor   The patient verbalized understanding of instructions, educational materials, and care plan provided today and DECLINED offer to receive copy of patient instructions, educational materials, and care plan.   The patient has been provided with contact information for the care management team and has been advised to call with any health related questions or concerns.   Bary Leriche, RN, MSN [facility] Care Management Care Management Coordinator Direct Line [phone]

## 2022-08-30 DIAGNOSIS — Z09 Encounter for follow-up examination after completed treatment for conditions other than malignant neoplasm: Secondary | ICD-10-CM | POA: Diagnosis not present

## 2022-08-30 DIAGNOSIS — D123 Benign neoplasm of transverse colon: Secondary | ICD-10-CM | POA: Diagnosis not present

## 2022-08-30 DIAGNOSIS — K648 Other hemorrhoids: Secondary | ICD-10-CM | POA: Diagnosis not present

## 2022-08-30 DIAGNOSIS — D120 Benign neoplasm of cecum: Secondary | ICD-10-CM | POA: Diagnosis not present

## 2022-08-30 DIAGNOSIS — K5730 Diverticulosis of large intestine without perforation or abscess without bleeding: Secondary | ICD-10-CM | POA: Diagnosis not present

## 2022-08-30 DIAGNOSIS — Z86010 Personal history of colonic polyps: Secondary | ICD-10-CM | POA: Diagnosis not present

## 2022-08-30 DIAGNOSIS — K644 Residual hemorrhoidal skin tags: Secondary | ICD-10-CM | POA: Diagnosis not present

## 2022-09-01 DIAGNOSIS — D123 Benign neoplasm of transverse colon: Secondary | ICD-10-CM | POA: Diagnosis not present

## 2022-09-22 DIAGNOSIS — B370 Candidal stomatitis: Secondary | ICD-10-CM | POA: Diagnosis not present

## 2022-11-07 DIAGNOSIS — Z9189 Other specified personal risk factors, not elsewhere classified: Secondary | ICD-10-CM | POA: Diagnosis not present

## 2022-11-07 DIAGNOSIS — N898 Other specified noninflammatory disorders of vagina: Secondary | ICD-10-CM | POA: Diagnosis not present

## 2023-02-15 ENCOUNTER — Other Ambulatory Visit: Payer: Self-pay | Admitting: Family Medicine

## 2023-02-15 DIAGNOSIS — Z0000 Encounter for general adult medical examination without abnormal findings: Secondary | ICD-10-CM

## 2023-02-20 ENCOUNTER — Ambulatory Visit
Admission: RE | Admit: 2023-02-20 | Discharge: 2023-02-20 | Disposition: A | Discharge status: Home / Self Care | Payer: Medicare Other | Source: Ambulatory Visit | Attending: Family Medicine | Admitting: Family Medicine

## 2023-02-20 DIAGNOSIS — Z1231 Encounter for screening mammogram for malignant neoplasm of breast: Secondary | ICD-10-CM | POA: Diagnosis not present

## 2023-02-20 DIAGNOSIS — Z0000 Encounter for general adult medical examination without abnormal findings: Secondary | ICD-10-CM

## 2023-02-22 DIAGNOSIS — I700 Atherosclerosis of aorta: Secondary | ICD-10-CM | POA: Diagnosis not present

## 2023-02-22 DIAGNOSIS — E785 Hyperlipidemia, unspecified: Secondary | ICD-10-CM | POA: Diagnosis not present

## 2023-02-22 DIAGNOSIS — M1A9XX0 Chronic gout, unspecified, without tophus (tophi): Secondary | ICD-10-CM | POA: Diagnosis not present

## 2023-02-22 DIAGNOSIS — F3341 Major depressive disorder, recurrent, in partial remission: Secondary | ICD-10-CM | POA: Diagnosis not present

## 2023-02-22 DIAGNOSIS — N1831 Chronic kidney disease, stage 3a: Secondary | ICD-10-CM | POA: Diagnosis not present

## 2023-02-22 DIAGNOSIS — I10 Essential (primary) hypertension: Secondary | ICD-10-CM | POA: Diagnosis not present

## 2023-05-08 IMAGING — MG MM DIGITAL SCREENING BILAT W/ TOMO AND CAD
8 of 17 series · 8 of 40 positions shown · non-contrast
Comparison: Previous exam(s).

CLINICAL DATA: Screening.

EXAM:
DIGITAL SCREENING BILATERAL MAMMOGRAM WITH TOMOSYNTHESIS AND CAD
TECHNIQUE: Bilateral screening digital craniocaudal and mediolateral oblique
mammograms were obtained. Bilateral screening digital breast
tomosynthesis was performed. The images were evaluated with
computer-aided detection.

[L MLO synth-2D (1 of 2)]
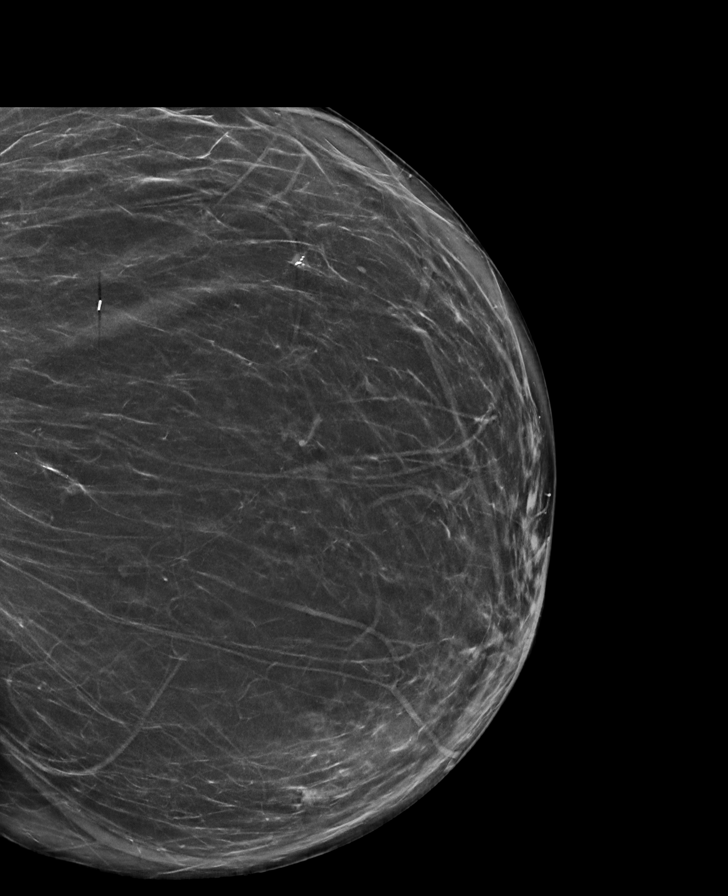

[L CC synth-2D (1 of 2)]
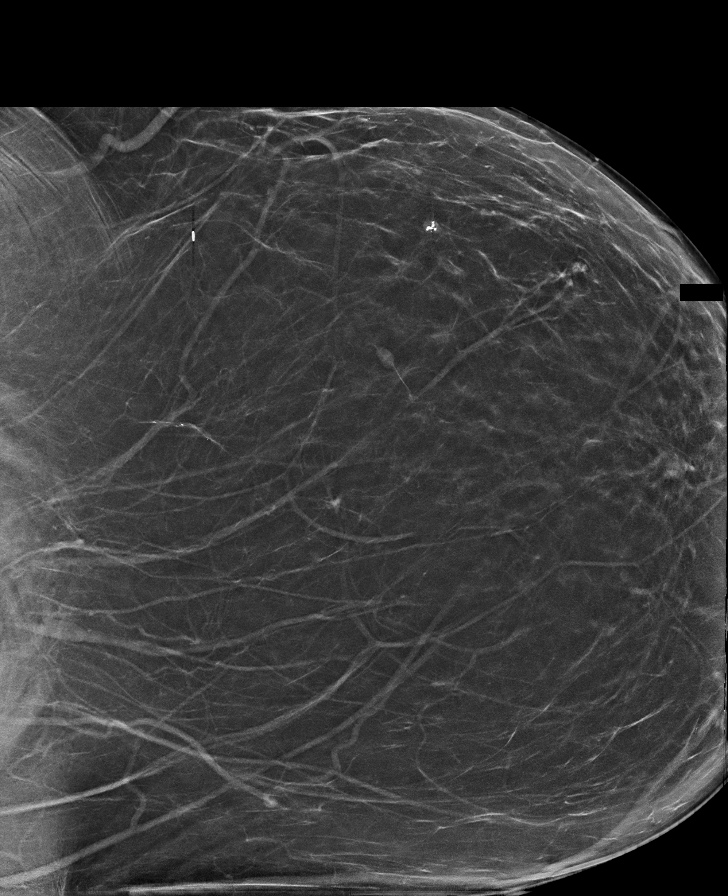

[R MLO synth-2D (1 of 3)]
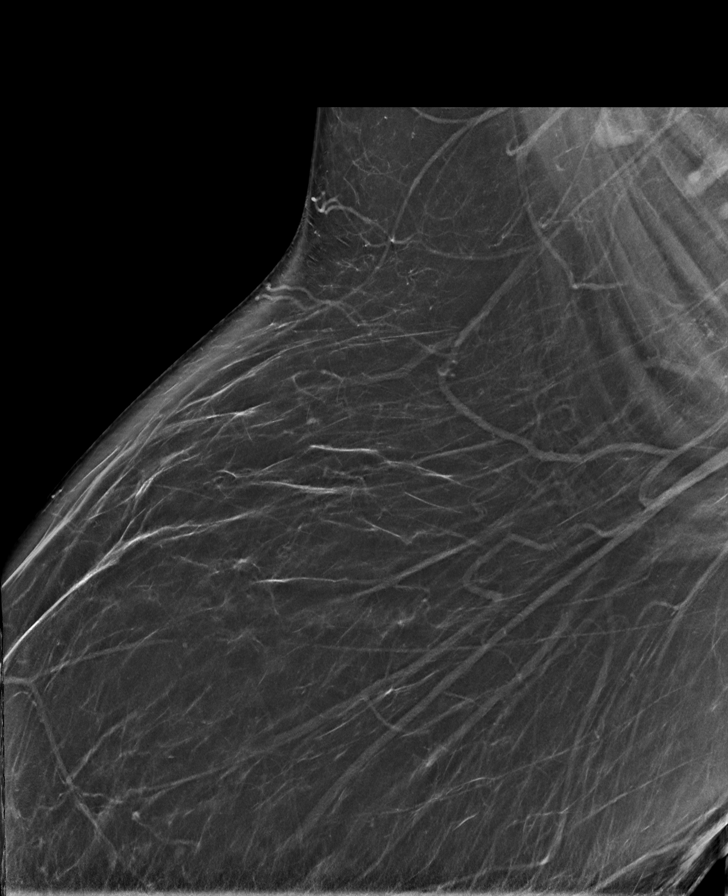

[R MLO synth-2D (2 of 3)]
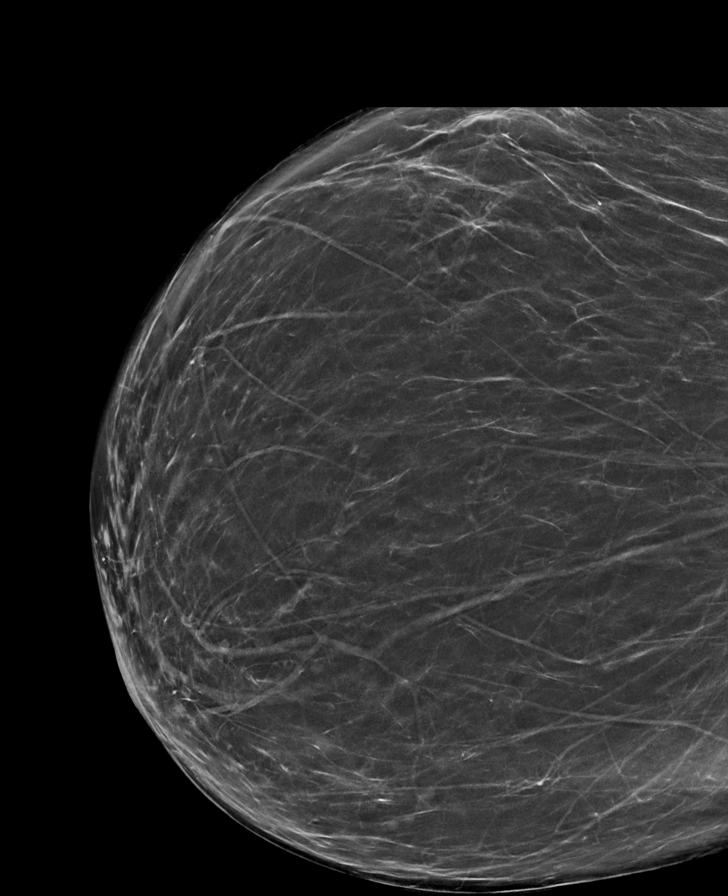

[L MLO synth-2D (2 of 2)]
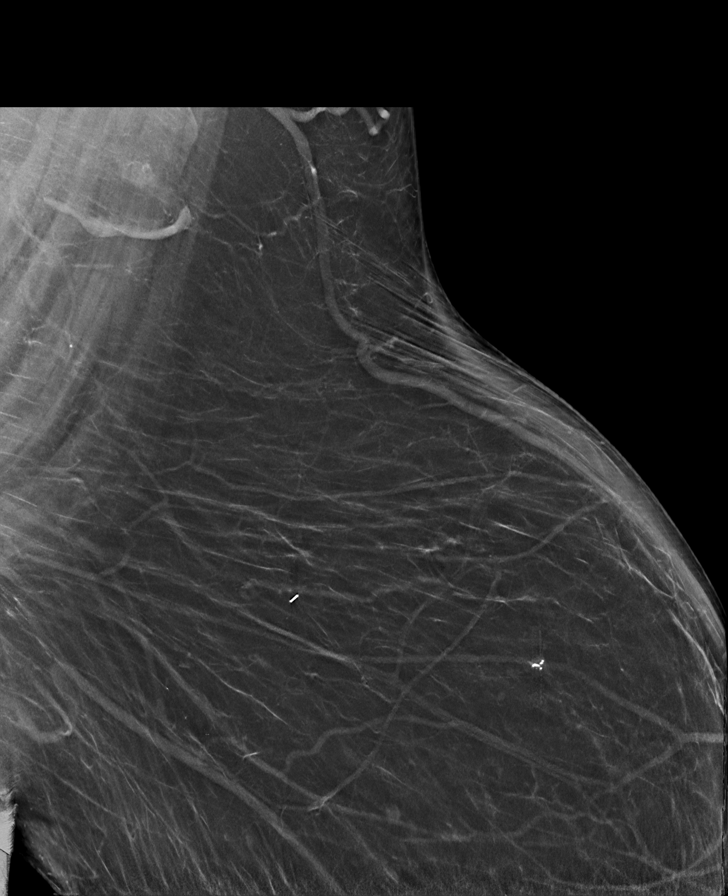

[L CC synth-2D (2 of 2)]
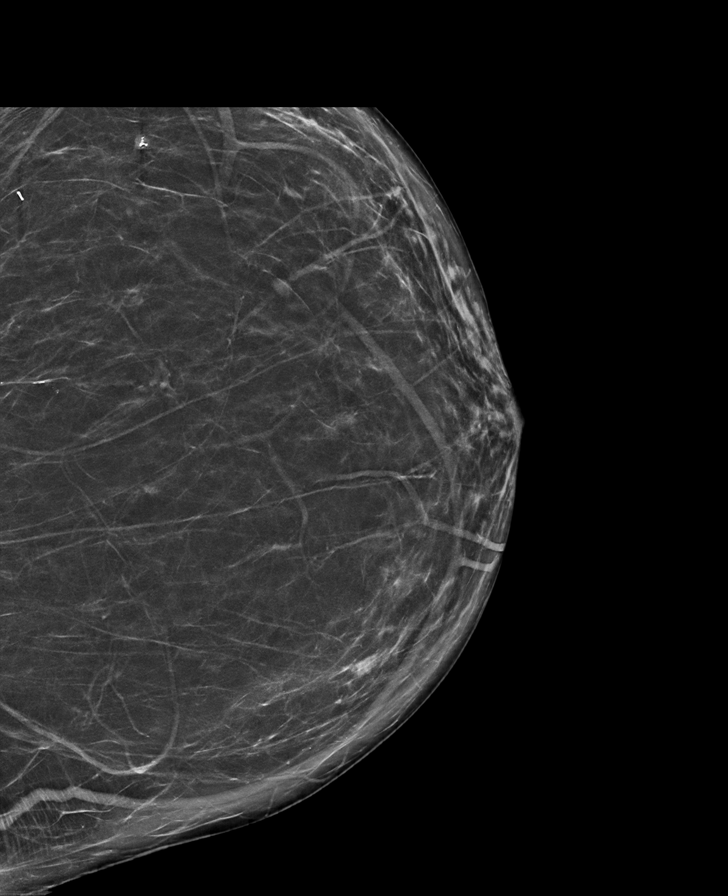

[R MLO synth-2D (3 of 3)]
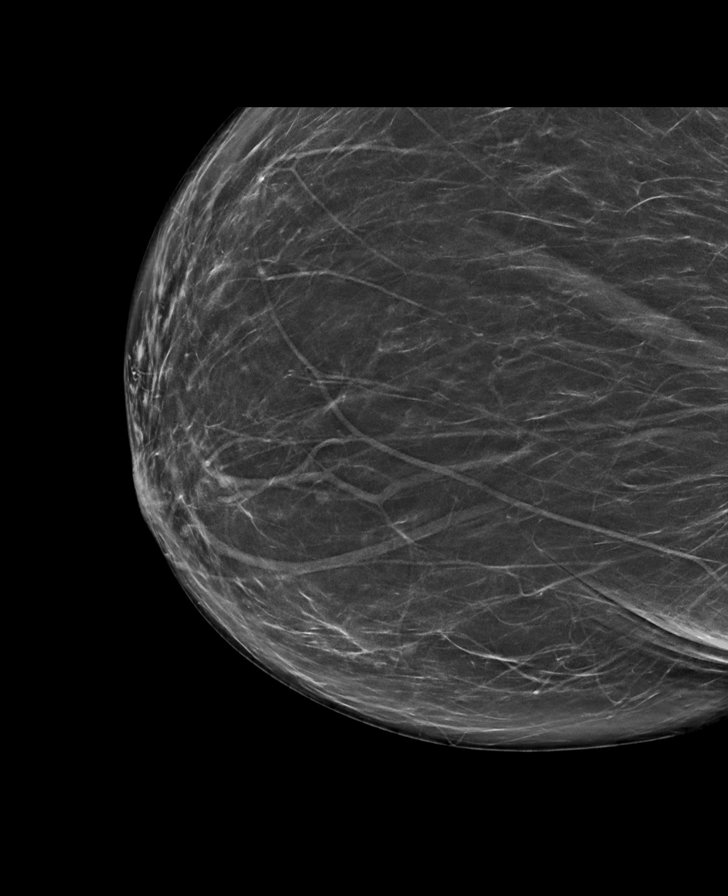

[L CC tomo · tomo slice 43/85.0]
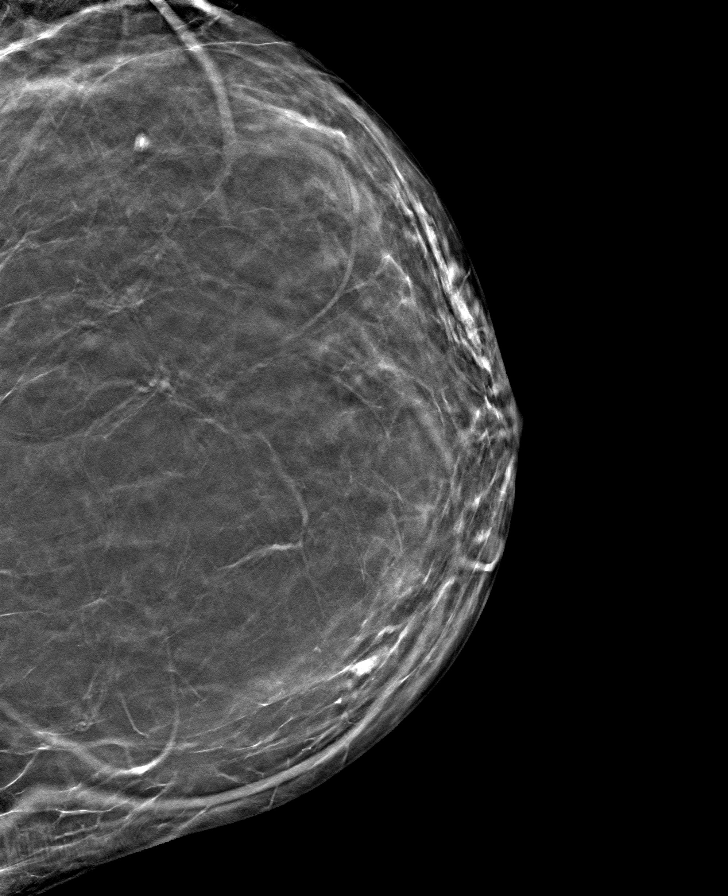

[8 of 40 positions shown; findings below may reference images not displayed]

ACR Breast Density Category b: There are scattered areas of
fibroglandular density.
FINDINGS: There are no findings suspicious for malignancy.
IMPRESSION: No mammographic evidence of malignancy. A result letter of this
screening mammogram will be mailed directly to the patient.

RECOMMENDATION:
Screening mammogram in one year. (Code:51-O-LD2)

BI-RADS CATEGORY  1: Negative.

## 2023-07-19 DIAGNOSIS — H5203 Hypermetropia, bilateral: Secondary | ICD-10-CM | POA: Diagnosis not present

## 2023-08-17 DIAGNOSIS — K279 Peptic ulcer, site unspecified, unspecified as acute or chronic, without hemorrhage or perforation: Secondary | ICD-10-CM | POA: Diagnosis not present

## 2023-08-17 DIAGNOSIS — K219 Gastro-esophageal reflux disease without esophagitis: Secondary | ICD-10-CM | POA: Diagnosis not present

## 2023-09-12 DIAGNOSIS — E559 Vitamin D deficiency, unspecified: Secondary | ICD-10-CM | POA: Diagnosis not present

## 2023-09-12 DIAGNOSIS — E785 Hyperlipidemia, unspecified: Secondary | ICD-10-CM | POA: Diagnosis not present

## 2023-09-12 DIAGNOSIS — M1A9XX0 Chronic gout, unspecified, without tophus (tophi): Secondary | ICD-10-CM | POA: Diagnosis not present

## 2023-09-12 DIAGNOSIS — I700 Atherosclerosis of aorta: Secondary | ICD-10-CM | POA: Diagnosis not present

## 2023-09-12 DIAGNOSIS — I10 Essential (primary) hypertension: Secondary | ICD-10-CM | POA: Diagnosis not present

## 2023-09-12 DIAGNOSIS — Z0000 Encounter for general adult medical examination without abnormal findings: Secondary | ICD-10-CM | POA: Diagnosis not present

## 2023-09-12 DIAGNOSIS — K219 Gastro-esophageal reflux disease without esophagitis: Secondary | ICD-10-CM | POA: Diagnosis not present

## 2023-09-12 DIAGNOSIS — N1831 Chronic kidney disease, stage 3a: Secondary | ICD-10-CM | POA: Diagnosis not present

## 2024-02-09 DIAGNOSIS — J029 Acute pharyngitis, unspecified: Secondary | ICD-10-CM | POA: Diagnosis not present

## 2024-02-09 DIAGNOSIS — R051 Acute cough: Secondary | ICD-10-CM | POA: Diagnosis not present

## 2024-02-09 DIAGNOSIS — R509 Fever, unspecified: Secondary | ICD-10-CM | POA: Diagnosis not present

## 2024-02-09 DIAGNOSIS — H6121 Impacted cerumen, right ear: Secondary | ICD-10-CM | POA: Diagnosis not present

## 2024-02-09 DIAGNOSIS — H66003 Acute suppurative otitis media without spontaneous rupture of ear drum, bilateral: Secondary | ICD-10-CM | POA: Diagnosis not present

## 2024-02-09 DIAGNOSIS — R0981 Nasal congestion: Secondary | ICD-10-CM | POA: Diagnosis not present

## 2024-02-19 ENCOUNTER — Other Ambulatory Visit: Payer: Self-pay | Admitting: Family Medicine

## 2024-02-19 DIAGNOSIS — Z1231 Encounter for screening mammogram for malignant neoplasm of breast: Secondary | ICD-10-CM

## 2024-02-21 ENCOUNTER — Inpatient Hospital Stay: Admission: RE | Admit: 2024-02-21 | Discharge: 2024-02-21 | Attending: Family Medicine | Admitting: Family Medicine

## 2024-02-21 DIAGNOSIS — Z1231 Encounter for screening mammogram for malignant neoplasm of breast: Secondary | ICD-10-CM | POA: Diagnosis not present

## 2024-03-01 DIAGNOSIS — I10 Essential (primary) hypertension: Secondary | ICD-10-CM | POA: Diagnosis not present

## 2024-03-01 DIAGNOSIS — N1831 Chronic kidney disease, stage 3a: Secondary | ICD-10-CM | POA: Diagnosis not present
